# Patient Record
Sex: Female | Born: 1965 | Race: Black or African American | Hispanic: No | Marital: Single | Smoking: Current every day smoker
Health system: Southern US, Community
[De-identification: ages and names within clinical notes are randomized; demographics above are authoritative.]

## PROBLEM LIST (undated history)

## (undated) DIAGNOSIS — Z978 Presence of other specified devices: Secondary | ICD-10-CM

## (undated) DIAGNOSIS — N210 Calculus in bladder: Secondary | ICD-10-CM

## (undated) DIAGNOSIS — N320 Bladder-neck obstruction: Secondary | ICD-10-CM

## (undated) DIAGNOSIS — Z960 Presence of urogenital implants: Secondary | ICD-10-CM

## (undated) DIAGNOSIS — I10 Essential (primary) hypertension: Secondary | ICD-10-CM

## (undated) HISTORY — PX: SALPINGOOPHORECTOMY: SHX82

---

## 2000-07-20 ENCOUNTER — Encounter: Payer: Self-pay | Admitting: Obstetrics and Gynecology

## 2000-07-20 ENCOUNTER — Ambulatory Visit: Admission: RE | Admit: 2000-07-20 | Discharge: 2000-07-20 | Payer: Self-pay | Admitting: Obstetrics and Gynecology

## 2000-07-26 ENCOUNTER — Other Ambulatory Visit: Admission: RE | Admit: 2000-07-26 | Discharge: 2000-07-26 | Payer: Self-pay | Admitting: Gynecology

## 2001-12-19 ENCOUNTER — Emergency Department (HOSPITAL_COMMUNITY): Admission: EM | Admit: 2001-12-19 | Discharge: 2001-12-19 | Payer: Self-pay | Admitting: Emergency Medicine

## 2004-04-20 ENCOUNTER — Inpatient Hospital Stay (HOSPITAL_COMMUNITY): Admission: AD | Admit: 2004-04-20 | Discharge: 2004-04-20 | Payer: Self-pay | Admitting: *Deleted

## 2004-10-01 ENCOUNTER — Emergency Department (HOSPITAL_COMMUNITY): Admission: EM | Admit: 2004-10-01 | Discharge: 2004-10-01 | Payer: Self-pay | Admitting: Emergency Medicine

## 2006-07-13 ENCOUNTER — Inpatient Hospital Stay (HOSPITAL_COMMUNITY): Admission: AD | Admit: 2006-07-13 | Discharge: 2006-07-13 | Payer: Self-pay | Admitting: Family Medicine

## 2009-12-22 ENCOUNTER — Emergency Department (HOSPITAL_COMMUNITY)
Admission: EM | Admit: 2009-12-22 | Discharge: 2009-12-23 | Payer: Self-pay | Source: Home / Self Care | Admitting: Emergency Medicine

## 2010-03-05 ENCOUNTER — Emergency Department (HOSPITAL_COMMUNITY)
Admission: EM | Admit: 2010-03-05 | Discharge: 2010-03-05 | Payer: Self-pay | Source: Home / Self Care | Admitting: Emergency Medicine

## 2010-03-08 LAB — DIFFERENTIAL
Basophils Absolute: 0 10*3/uL (ref 0.0–0.1)
Basophils Relative: 0 % (ref 0–1)
Eosinophils Absolute: 0.1 10*3/uL (ref 0.0–0.7)
Eosinophils Relative: 1 % (ref 0–5)
Lymphocytes Relative: 32 % (ref 12–46)
Lymphs Abs: 2.2 10*3/uL (ref 0.7–4.0)
Monocytes Absolute: 0.5 10*3/uL (ref 0.1–1.0)
Monocytes Relative: 8 % (ref 3–12)
Neutro Abs: 4.2 10*3/uL (ref 1.7–7.7)
Neutrophils Relative %: 59 % (ref 43–77)

## 2010-03-08 LAB — POCT I-STAT, CHEM 8
BUN: 5 mg/dL — ABNORMAL LOW (ref 6–23)
Calcium, Ion: 1.11 mmol/L — ABNORMAL LOW (ref 1.12–1.32)
Chloride: 109 mEq/L (ref 96–112)
Creatinine, Ser: 0.9 mg/dL (ref 0.4–1.2)
Glucose, Bld: 101 mg/dL — ABNORMAL HIGH (ref 70–99)
HCT: 42 % (ref 36.0–46.0)
Hemoglobin: 14.3 g/dL (ref 12.0–15.0)
Potassium: 4.6 mEq/L (ref 3.5–5.1)
Sodium: 141 mEq/L (ref 135–145)
TCO2: 27 mmol/L (ref 0–100)

## 2010-03-08 LAB — CBC
HCT: 38.6 % (ref 36.0–46.0)
Hemoglobin: 13 g/dL (ref 12.0–15.0)
MCH: 30.7 pg (ref 26.0–34.0)
MCHC: 33.7 g/dL (ref 30.0–36.0)
MCV: 91.3 fL (ref 78.0–100.0)
Platelets: 210 10*3/uL (ref 150–400)
RBC: 4.23 MIL/uL (ref 3.87–5.11)
RDW: 14.2 % (ref 11.5–15.5)
WBC: 7.1 10*3/uL (ref 4.0–10.5)

## 2010-03-08 LAB — POCT PREGNANCY, URINE: Preg Test, Ur: NEGATIVE

## 2010-04-02 ENCOUNTER — Other Ambulatory Visit (HOSPITAL_COMMUNITY): Payer: Self-pay | Admitting: Family Medicine

## 2010-04-02 DIAGNOSIS — Z1231 Encounter for screening mammogram for malignant neoplasm of breast: Secondary | ICD-10-CM

## 2010-04-20 ENCOUNTER — Other Ambulatory Visit (HOSPITAL_COMMUNITY): Payer: Self-pay | Admitting: Family Medicine

## 2010-04-20 ENCOUNTER — Ambulatory Visit (HOSPITAL_COMMUNITY)
Admission: RE | Admit: 2010-04-20 | Discharge: 2010-04-20 | Disposition: A | Discharge status: Home / Self Care | Payer: Self-pay | Source: Ambulatory Visit | Attending: Family Medicine | Admitting: Family Medicine

## 2010-04-20 DIAGNOSIS — Z1231 Encounter for screening mammogram for malignant neoplasm of breast: Secondary | ICD-10-CM

## 2010-05-31 ENCOUNTER — Other Ambulatory Visit: Payer: Self-pay | Admitting: Family Medicine

## 2010-06-03 ENCOUNTER — Other Ambulatory Visit (HOSPITAL_COMMUNITY): Payer: Self-pay | Admitting: Family Medicine

## 2010-06-03 DIAGNOSIS — N912 Amenorrhea, unspecified: Secondary | ICD-10-CM

## 2010-06-10 ENCOUNTER — Ambulatory Visit (HOSPITAL_COMMUNITY)
Admission: RE | Admit: 2010-06-10 | Discharge: 2010-06-10 | Disposition: A | Discharge status: Home / Self Care | Payer: Self-pay | Source: Ambulatory Visit | Attending: Family Medicine | Admitting: Family Medicine

## 2010-06-10 ENCOUNTER — Other Ambulatory Visit (HOSPITAL_COMMUNITY): Payer: Self-pay | Admitting: Family Medicine

## 2010-06-10 DIAGNOSIS — D259 Leiomyoma of uterus, unspecified: Secondary | ICD-10-CM | POA: Insufficient documentation

## 2010-06-10 DIAGNOSIS — N912 Amenorrhea, unspecified: Secondary | ICD-10-CM

## 2011-05-05 ENCOUNTER — Other Ambulatory Visit (HOSPITAL_COMMUNITY): Payer: Self-pay | Admitting: Family Medicine

## 2011-05-05 DIAGNOSIS — Z1231 Encounter for screening mammogram for malignant neoplasm of breast: Secondary | ICD-10-CM

## 2011-06-02 ENCOUNTER — Ambulatory Visit (HOSPITAL_COMMUNITY)
Admission: RE | Admit: 2011-06-02 | Discharge: 2011-06-02 | Disposition: A | Discharge status: Home / Self Care | Payer: Self-pay | Source: Ambulatory Visit | Attending: Family Medicine | Admitting: Family Medicine

## 2011-06-02 DIAGNOSIS — Z1231 Encounter for screening mammogram for malignant neoplasm of breast: Secondary | ICD-10-CM | POA: Insufficient documentation

## 2012-04-26 LAB — HM PAP SMEAR

## 2012-05-02 ENCOUNTER — Other Ambulatory Visit (HOSPITAL_COMMUNITY): Payer: Self-pay | Admitting: Family Medicine

## 2012-05-02 DIAGNOSIS — Z1231 Encounter for screening mammogram for malignant neoplasm of breast: Secondary | ICD-10-CM

## 2012-06-04 ENCOUNTER — Ambulatory Visit (HOSPITAL_COMMUNITY)
Admission: RE | Admit: 2012-06-04 | Discharge: 2012-06-04 | Disposition: A | Discharge status: Home / Self Care | Payer: Self-pay | Source: Ambulatory Visit | Attending: Family Medicine | Admitting: Family Medicine

## 2012-06-04 DIAGNOSIS — Z1231 Encounter for screening mammogram for malignant neoplasm of breast: Secondary | ICD-10-CM | POA: Insufficient documentation

## 2012-06-05 ENCOUNTER — Other Ambulatory Visit: Payer: Self-pay | Admitting: Family Medicine

## 2012-06-05 DIAGNOSIS — R928 Other abnormal and inconclusive findings on diagnostic imaging of breast: Secondary | ICD-10-CM

## 2012-06-18 ENCOUNTER — Other Ambulatory Visit: Payer: Self-pay | Admitting: Obstetrics and Gynecology

## 2012-06-18 DIAGNOSIS — R928 Other abnormal and inconclusive findings on diagnostic imaging of breast: Secondary | ICD-10-CM

## 2012-06-19 ENCOUNTER — Encounter (HOSPITAL_COMMUNITY): Payer: Self-pay

## 2012-06-19 ENCOUNTER — Ambulatory Visit (HOSPITAL_COMMUNITY)
Admission: RE | Admit: 2012-06-19 | Discharge: 2012-06-19 | Disposition: A | Discharge status: Home / Self Care | Payer: Self-pay | Source: Ambulatory Visit | Attending: Obstetrics and Gynecology | Admitting: Obstetrics and Gynecology

## 2012-06-19 ENCOUNTER — Other Ambulatory Visit: Payer: Self-pay

## 2012-06-19 DIAGNOSIS — Z1239 Encounter for other screening for malignant neoplasm of breast: Secondary | ICD-10-CM

## 2012-06-19 NOTE — Progress Notes (Signed)
Patient referred to [facility] by the [facility] due to needing additional imaging of her left breast. Screening mammogram completed 06/04/2012 at the [facility].  Pap Smear:    Pap smear not completed today. Last Pap smear was April 2014 at [facility] and normal per patient. Per patient no history of an abnormal Pap smear. No Pap smear results in EPIC.  Physical exam: Breasts Breasts symmetrical. No skin abnormalities bilateral breasts. No nipple retraction bilateral breasts. No nipple discharge bilateral breasts. No lymphadenopathy. No lumps palpated bilateral breasts. No complaints of pain or tenderness on exam. Referred patient to the [facility] of [facility] for left breast diagnostic mammogram and possible ultrasound per recommendation. Appointment scheduled for Wednesday, June 20, 2012 at 0740.   Pelvic/Bimanual No Pap smear completed today since last Pap smear was April 2014. Pap smear not indicated per BCCCP guidelines.

## 2012-06-19 NOTE — Addendum Note (Signed)
Encounter addended by: Saintclair Halsted, RN on: 06/19/2012  4:08 PM<BR>     Documentation filed: Visit Diagnoses

## 2012-06-19 NOTE — Patient Instructions (Signed)
Taught patient how to perform BSE. Patient did not need a Pap smear today due to last Pap smear was April 2014 per patient. Told patient about free cervical cancer screenings to receive a Pap smear if would like one in two years. Let her know BCCCP will cover Pap smears every 3 years unless has a history of abnormal Pap smears. Referred patient to the [facility] of [facility] for left breast diagnostic mammogram and possible ultrasound per recommendation. Appointment scheduled for Wednesday, June 20, 2012 at 0740. Patient aware of appointment and will be there. Smoking cessation information given to patient and information about smoking cessation program offered at the [facility]. Patient verbalized understanding.

## 2012-06-20 ENCOUNTER — Ambulatory Visit
Admission: RE | Admit: 2012-06-20 | Discharge: 2012-06-20 | Disposition: A | Discharge status: Home / Self Care | Payer: No Typology Code available for payment source | Source: Ambulatory Visit | Attending: Family Medicine | Admitting: Family Medicine

## 2012-06-20 DIAGNOSIS — R928 Other abnormal and inconclusive findings on diagnostic imaging of breast: Secondary | ICD-10-CM

## 2012-11-27 ENCOUNTER — Other Ambulatory Visit: Payer: Self-pay | Admitting: Obstetrics and Gynecology

## 2012-11-27 DIAGNOSIS — N6320 Unspecified lump in the left breast, unspecified quadrant: Secondary | ICD-10-CM

## 2012-12-21 ENCOUNTER — Ambulatory Visit
Admission: RE | Admit: 2012-12-21 | Discharge: 2012-12-21 | Disposition: A | Discharge status: Home / Self Care | Payer: Self-pay | Source: Ambulatory Visit | Attending: Obstetrics and Gynecology | Admitting: Obstetrics and Gynecology

## 2012-12-21 DIAGNOSIS — N6320 Unspecified lump in the left breast, unspecified quadrant: Secondary | ICD-10-CM

## 2013-07-25 ENCOUNTER — Other Ambulatory Visit: Payer: Self-pay | Admitting: Obstetrics and Gynecology

## 2013-07-25 DIAGNOSIS — N63 Unspecified lump in unspecified breast: Secondary | ICD-10-CM

## 2013-07-31 ENCOUNTER — Other Ambulatory Visit: Payer: Self-pay

## 2013-07-31 ENCOUNTER — Other Ambulatory Visit: Payer: Self-pay | Admitting: Obstetrics and Gynecology

## 2013-07-31 DIAGNOSIS — N63 Unspecified lump in unspecified breast: Secondary | ICD-10-CM

## 2013-08-06 ENCOUNTER — Ambulatory Visit
Admission: RE | Admit: 2013-08-06 | Discharge: 2013-08-06 | Disposition: A | Discharge status: Home / Self Care | Payer: 59 | Source: Ambulatory Visit | Attending: Obstetrics and Gynecology | Admitting: Obstetrics and Gynecology

## 2013-08-06 ENCOUNTER — Encounter (INDEPENDENT_AMBULATORY_CARE_PROVIDER_SITE_OTHER): Payer: Self-pay

## 2013-08-06 DIAGNOSIS — N63 Unspecified lump in unspecified breast: Secondary | ICD-10-CM

## 2013-09-19 LAB — LAB REPORT - SCANNED: A1c: 5.7

## 2013-10-23 LAB — LAB REPORT - SCANNED

## 2013-12-23 ENCOUNTER — Encounter (HOSPITAL_COMMUNITY): Payer: Self-pay

## 2014-08-20 LAB — LAB REPORT - SCANNED: A1c: 5.8

## 2014-09-22 ENCOUNTER — Other Ambulatory Visit: Payer: Self-pay

## 2014-09-22 DIAGNOSIS — Z1231 Encounter for screening mammogram for malignant neoplasm of breast: Secondary | ICD-10-CM

## 2014-09-26 ENCOUNTER — Ambulatory Visit: Payer: Self-pay

## 2014-09-30 ENCOUNTER — Other Ambulatory Visit (HOSPITAL_COMMUNITY): Payer: Self-pay | Admitting: Internal Medicine

## 2014-09-30 ENCOUNTER — Ambulatory Visit
Admission: RE | Admit: 2014-09-30 | Discharge: 2014-09-30 | Disposition: A | Discharge status: Home / Self Care | Payer: Self-pay | Source: Ambulatory Visit

## 2014-09-30 ENCOUNTER — Ambulatory Visit (HOSPITAL_COMMUNITY)
Admission: RE | Admit: 2014-09-30 | Discharge: 2014-09-30 | Disposition: A | Discharge status: Home / Self Care | Payer: 59 | Source: Ambulatory Visit | Attending: Internal Medicine | Admitting: Internal Medicine

## 2014-09-30 DIAGNOSIS — R52 Pain, unspecified: Secondary | ICD-10-CM

## 2014-09-30 DIAGNOSIS — Z1231 Encounter for screening mammogram for malignant neoplasm of breast: Secondary | ICD-10-CM

## 2014-09-30 DIAGNOSIS — R2231 Localized swelling, mass and lump, right upper limb: Secondary | ICD-10-CM | POA: Insufficient documentation

## 2014-10-02 ENCOUNTER — Other Ambulatory Visit: Payer: Self-pay | Admitting: Internal Medicine

## 2014-10-02 DIAGNOSIS — R928 Other abnormal and inconclusive findings on diagnostic imaging of breast: Secondary | ICD-10-CM

## 2014-10-09 ENCOUNTER — Ambulatory Visit
Admission: RE | Admit: 2014-10-09 | Discharge: 2014-10-09 | Disposition: A | Discharge status: Home / Self Care | Payer: 59 | Source: Ambulatory Visit | Attending: Internal Medicine | Admitting: Internal Medicine

## 2014-10-09 ENCOUNTER — Other Ambulatory Visit: Payer: Self-pay | Admitting: Internal Medicine

## 2014-10-09 DIAGNOSIS — R928 Other abnormal and inconclusive findings on diagnostic imaging of breast: Secondary | ICD-10-CM

## 2014-10-17 ENCOUNTER — Ambulatory Visit
Admission: RE | Admit: 2014-10-17 | Discharge: 2014-10-17 | Disposition: A | Discharge status: Home / Self Care | Payer: 59 | Source: Ambulatory Visit | Attending: Internal Medicine | Admitting: Internal Medicine

## 2014-10-17 ENCOUNTER — Other Ambulatory Visit: Payer: Self-pay | Admitting: Internal Medicine

## 2014-10-17 DIAGNOSIS — R928 Other abnormal and inconclusive findings on diagnostic imaging of breast: Secondary | ICD-10-CM

## 2014-10-17 HISTORY — PX: BREAST BIOPSY: SHX20

## 2014-10-24 LAB — HM PAP SMEAR
Chlamydia, Swab/Urine, PCR: NEGATIVE
HM Pap smear: ABNORMAL
HPV, high-risk: NEGATIVE

## 2015-04-05 ENCOUNTER — Encounter (HOSPITAL_COMMUNITY): Payer: Self-pay | Admitting: Emergency Medicine

## 2015-04-05 ENCOUNTER — Emergency Department (HOSPITAL_COMMUNITY): Payer: BLUE CROSS/BLUE SHIELD

## 2015-04-05 ENCOUNTER — Emergency Department (HOSPITAL_COMMUNITY)
Admission: EM | Admit: 2015-04-05 | Discharge: 2015-04-05 | Disposition: A | Discharge status: Home / Self Care | Payer: BLUE CROSS/BLUE SHIELD | Attending: Emergency Medicine | Admitting: Emergency Medicine

## 2015-04-05 DIAGNOSIS — I10 Essential (primary) hypertension: Secondary | ICD-10-CM | POA: Diagnosis not present

## 2015-04-05 DIAGNOSIS — Z3202 Encounter for pregnancy test, result negative: Secondary | ICD-10-CM | POA: Diagnosis not present

## 2015-04-05 DIAGNOSIS — R339 Retention of urine, unspecified: Secondary | ICD-10-CM | POA: Diagnosis present

## 2015-04-05 DIAGNOSIS — F17210 Nicotine dependence, cigarettes, uncomplicated: Secondary | ICD-10-CM | POA: Diagnosis not present

## 2015-04-05 DIAGNOSIS — R1030 Lower abdominal pain, unspecified: Secondary | ICD-10-CM

## 2015-04-05 DIAGNOSIS — N320 Bladder-neck obstruction: Secondary | ICD-10-CM | POA: Diagnosis not present

## 2015-04-05 HISTORY — DX: Essential (primary) hypertension: I10

## 2015-04-05 LAB — BASIC METABOLIC PANEL
Anion gap: 12 (ref 5–15)
BUN: 12 mg/dL (ref 6–20)
CO2: 21 mmol/L — ABNORMAL LOW (ref 22–32)
Calcium: 9.1 mg/dL (ref 8.9–10.3)
Chloride: 110 mmol/L (ref 101–111)
Creatinine, Ser: 0.73 mg/dL (ref 0.44–1.00)
GFR calc Af Amer: >60 mL/min (ref >60)
GFR calc non Af Amer: >60 mL/min (ref >60)
Glucose, Bld: 94 mg/dL (ref 65–99)
Potassium: 4.6 mmol/L (ref 3.5–5.1)
Sodium: 143 mmol/L (ref 135–145)

## 2015-04-05 LAB — URINALYSIS, ROUTINE W REFLEX MICROSCOPIC
BILIRUBIN URINE: NEGATIVE
Glucose, UA: NEGATIVE mg/dL
Ketones, ur: NEGATIVE mg/dL
NITRITE: NEGATIVE
PH: 6.5 (ref 5.0–8.0)
Protein, ur: 30 mg/dL — AB
Specific Gravity, Urine: 1.019 (ref 1.005–1.030)

## 2015-04-05 LAB — CBC WITH DIFFERENTIAL/PLATELET
Basophils Absolute: 0 10*3/uL (ref 0.0–0.1)
Basophils Relative: 0 %
Eosinophils Absolute: 0.1 10*3/uL (ref 0.0–0.7)
Eosinophils Relative: 2 %
HCT: 36 % (ref 36.0–46.0)
Hemoglobin: 11.6 g/dL — ABNORMAL LOW (ref 12.0–15.0)
Lymphocytes Relative: 37 %
Lymphs Abs: 2.2 10*3/uL (ref 0.7–4.0)
MCH: 28.3 pg (ref 26.0–34.0)
MCHC: 32.2 g/dL (ref 30.0–36.0)
MCV: 87.8 fL (ref 78.0–100.0)
Monocytes Absolute: 0.4 10*3/uL (ref 0.1–1.0)
Monocytes Relative: 7 %
Neutro Abs: 3.2 10*3/uL (ref 1.7–7.7)
Neutrophils Relative %: 54 %
Platelets: 200 10*3/uL (ref 150–400)
RBC: 4.1 MIL/uL (ref 3.87–5.11)
RDW: 14.3 % (ref 11.5–15.5)
WBC: 6 10*3/uL (ref 4.0–10.5)

## 2015-04-05 LAB — URINE MICROSCOPIC-ADD ON

## 2015-04-05 LAB — PREGNANCY, URINE: PREG TEST UR: NEGATIVE

## 2015-04-05 MED ORDER — MORPHINE SULFATE (PF) 4 MG/ML IV SOLN
4.0000 mg | Freq: Once | INTRAVENOUS | Status: AC
  Administered 2015-04-05: 4 mg via INTRAVENOUS
  Filled 2015-04-05: qty 1

## 2015-04-05 MED ORDER — SODIUM CHLORIDE 0.9 % IV BOLUS (SEPSIS)
1000.0000 mL | Freq: Once | INTRAVENOUS | Status: AC
  Administered 2015-04-05: 1000 mL via INTRAVENOUS

## 2015-04-05 MED ORDER — DEXTROSE 5 % IV SOLN
1.0000 g | Freq: Once | INTRAVENOUS | Status: AC
  Administered 2015-04-05: 1 g via INTRAVENOUS
  Filled 2015-04-05: qty 10

## 2015-04-05 MED ORDER — KETOROLAC TROMETHAMINE 30 MG/ML IJ SOLN
30.0000 mg | Freq: Once | INTRAMUSCULAR | Status: AC
  Administered 2015-04-05: 30 mg via INTRAVENOUS
  Filled 2015-04-05: qty 1

## 2015-04-05 NOTE — ED Provider Notes (Signed)
CSN: [id number]     Arrival date & time 04/05/15  0506 History   First MD Initiated Contact with Patient 04/05/15 [phone]     Chief Complaint  Patient presents with  . Urinary Urgency  . Urinary Retention     (Consider location/radiation/quality/duration/timing/severity/associated sxs/prior Treatment) HPI Patient presents to the Emergency Department complaining of dysuria and urinary retention. The patient is a current smoker (0.5 PPD), has history of hypertension. The patient states that the symptoms began yesterday morning. She states that every time she urinates she experiences dysuria described as a burning sensation. She also states that she feels like her urine is not coming out and that she has to push, but even after urinating she states her abdomen still feels like it is full of urine. The patient states that she has had a previous UTI before where she experienced dysuria but has not had the retention symptoms before. She states that she is currently not sexually active and is actively going through menopause, cannot remember when LMP was. The patient denies hematuria, vaginal discharge or bleeding, abdominal pain, flank pain, fevers, chills, night sweats, nausea, vomiting, diarrhea, SOB, chest pain, dizziness, lightheadedness or palpitations.   Past Medical History  Diagnosis Date  . Hypertension    History reviewed. No pertinent past surgical history. Family History  Problem Relation Age of Onset  . Cancer Mother     lung cancer  . Breast cancer Sister   . Seizures Sister    Social History  Substance Use Topics  . Smoking status: Current Every Day Smoker -- 0.25 packs/day for 32 years    Types: Cigarettes  . Smokeless tobacco: Never Used  . Alcohol Use: No   OB History    Gravida Para Term Preterm AB TAB SAB Ectopic Multiple Living   3 2 2  1 1    2      Review of Systems All other systems negative except as documented in the HPI. All pertinent positives and negatives as  reviewed in the HPI.   Allergies  Review of patient's allergies indicates no known allergies.  Home Medications   Prior to Admission medications   Not on File   BP 190/109 mmHg  Pulse 81  Temp(Src) 98 F (36.7 C) (Oral)  Resp 17  SpO2 99%  LMP 04/17/2010 Physical Exam  Constitutional: She is oriented to person, place, and time. She appears well-developed and well-nourished. No distress.  HENT:  Head: Normocephalic and atraumatic.  Right Ear: External ear normal.  Left Ear: External ear normal.  Eyes: Conjunctivae and EOM are normal. Pupils are equal, round, and reactive to light.  Neck: Normal range of motion. Neck supple.  Cardiovascular: Normal rate, regular rhythm and normal heart sounds.  Exam reveals no gallop and no friction rub.   No murmur heard. Pulmonary/Chest: Effort normal and breath sounds normal. No respiratory distress. She has no wheezes. She has no rales. She exhibits no tenderness.  Abdominal: Soft. Bowel sounds are normal. She exhibits distension. She exhibits no mass. There is tenderness (suprapubic). There is no rebound and no guarding.  Musculoskeletal: Normal range of motion.  Neurological: She is alert and oriented to person, place, and time.  Skin: Skin is warm and dry. No erythema.  Psychiatric: She has a normal mood and affect. Her behavior is normal. Thought content normal.    ED Course  Procedures (including critical care time) Labs Review Labs Reviewed  URINALYSIS, ROUTINE W REFLEX MICROSCOPIC (NOT AT [facility])  Imaging Review No results found. I have personally reviewed and evaluated these images and lab results as part of my medical decision-making.   EKG Interpretation None    0742 Patient in significant amount of pain, tearful and uneasy.   0905 Patient states that her pain is much improved after administration of Tordol and Morphine. 1040 Patient states that she is no longer to urinate, tried to cath patient, but was unable to do  so, patient is in pain, will give another dose of Morphine.  There was a significant delay in spoke with urology.  Once we got the results.  Due to the fact she has obstructed bladder outlet patient was seen by urology here in the emergency department and will follow up with them for further evaluation and care.  Patient is advised with plan at all questions were answered    Dalia Heading, PA-C 04/05/15 1412  Tanna Furry, MD 04/14/15 2253

## 2015-04-05 NOTE — ED Notes (Signed)
CT advised patient needs a pregnancy test before coming for her scan

## 2015-04-05 NOTE — ED Notes (Signed)
CT aware pregnancy test results back and patient is ready for her scan

## 2015-04-05 NOTE — ED Notes (Signed)
Urinary leg bag provided to patient per Dr. Ottis Stain request. Pt instructed on foley catheter care at home, acknowledged understanding.

## 2015-04-05 NOTE — ED Notes (Addendum)
PA Lawyer advised to attempt to cath patient again with a size 18 fr foley catheter and let him know if there is still resistance hindering advancement of tube.

## 2015-04-05 NOTE — ED Notes (Signed)
Patient here with urinary urgency and retention that started yesterday.  She denies any nausea or vomiting, but patient is having pain when she does have to urinate.

## 2015-04-05 NOTE — ED Notes (Signed)
Urologist at bedside to place foley catheter.

## 2015-04-05 NOTE — ED Notes (Signed)
ED tech advised she was unable to advance catheter into patient's bladder due to resistance, PA Lawyer made aware, advised he would order more pain medication for patient and that CT scan needs to be done as soon as possible.

## 2015-04-05 NOTE — ED Notes (Signed)
Urologist at bedside.

## 2015-04-05 NOTE — ED Notes (Signed)
This RN was called into the room, patient was bent over in pain, crying, stating she is now unable to urinate. PA Lawyer made aware, PA advised patient can be in/out cathed.

## 2015-04-05 NOTE — Consult Note (Signed)
Urology Consult   Physician requesting consult: Alyson Locket, MD  Reason for consult: acute urinary retention  History of Present Illness: Melanie Reed is a 50 y.o. female with sudden onset dysuria and burning beginning 24 hours ago followed by sudden onset of inability to void (as of this morning). She has been experiencing pain and suprapubic pressure. CT imaging was obtained and noted a 15 x 8 mm proximal to mid urethral calculus and a significantly distended bladder with moderate bilateral hydronephrosis. Serum labs were within the normal limits. She was able to void in very small spurts and that urine was sent for culture, UA without significant infectious parameters.  She denies a history of voiding or storage urinary symptoms, hematuria, UTIs, STDs, urolithiasis, GU malignancy/trauma/surgery.  The patient denies hematuria, vaginal discharge or bleeding, abdominal pain, flank pain, fevers, chills, night sweats, nausea, vomiting, diarrhea, SOB, chest pain, dizziness, lightheadedness or palpitations.   Past Medical History  Diagnosis Date  . Hypertension     History reviewed. No pertinent past surgical history.   Current Hospital Medications:  Home meds:    Medication List    Notice    You have not been prescribed any medications.      Scheduled Meds: .  morphine injection  4 mg Intravenous Once   Continuous Infusions:  PRN Meds:.  Allergies: No Known Allergies  Family History  Problem Relation Age of Onset  . Cancer Mother     lung cancer  . Breast cancer Sister   . Seizures Sister     Social History:  reports that she has been smoking Cigarettes.  She has a 8 pack-year smoking history. She has never used smokeless tobacco. She reports that she uses illicit drugs ("Crack" cocaine). She reports that she does not drink alcohol.  ROS: A complete review of systems was performed.  All systems are negative except for pertinent findings as noted.  Physical Exam  (performed with a chaperone):  Vital signs in last 24 hours: Temp:  [97.7 F (36.5 C)-98 F (36.7 C)] 98 F (36.7 C) (02/12 0555) Pulse Rate:  [55-84] 56 (02/12 1015) Resp:  [16-17] 17 (02/12 1015) BP: (151-196)/(74-121) 157/76 mmHg (02/12 1145) SpO2:  [95 %-100 %] 95 % (02/12 1145) Constitutional:  Alert and oriented, No acute distress Cardiovascular: Regular rate and rhythm, No JVD Respiratory: Normal respiratory effort, Lungs clear bilaterally GI: Abdomen is soft, nontender, nondistended, no abdominal masses, no palpable urethral mass. GU: No CVA tenderness, normal female pelvic anatoms Lymphatic: No lymphadenopathy Neurologic: Grossly intact, no focal deficits Psychiatric: Normal mood and affect  Laboratory Data:   Recent Labs  04/05/15 0730  WBC 6.0  HGB 11.6*  HCT 36.0  PLT 200     Recent Labs  04/05/15 0730  NA 143  K 4.6  CL 110  GLUCOSE 94  BUN 12  CALCIUM 9.1  CREATININE 0.73     Results for orders placed or performed during the hospital encounter of 04/05/15 (from the past 24 hour(s))  Urinalysis, Routine w reflex microscopic-may I&O cath if menses (not at [facility])     Status: Abnormal   Collection Time: 04/05/15  5:15 AM  Result Value Ref Range   Color, Urine YELLOW YELLOW   APPearance CLEAR CLEAR   Specific Gravity, Urine 1.019 1.005 - 1.030   pH 6.5 5.0 - 8.0   Glucose, UA NEGATIVE NEGATIVE mg/dL   Hgb urine dipstick LARGE (A) NEGATIVE   Bilirubin Urine NEGATIVE NEGATIVE   Ketones, ur  NEGATIVE NEGATIVE mg/dL   Protein, ur 30 (A) NEGATIVE mg/dL   Nitrite NEGATIVE NEGATIVE   Leukocytes, UA SMALL (A) NEGATIVE  Urine microscopic-add on     Status: Abnormal   Collection Time: 04/05/15  5:15 AM  Result Value Ref Range   Squamous Epithelial / LPF 0-5 (A) NONE SEEN   WBC, UA 6-30 0 - 5 WBC/hpf   RBC / HPF 6-30 0 - 5 RBC/hpf   Bacteria, UA MANY (A) NONE SEEN  Pregnancy, urine     Status: None   Collection Time: 04/05/15  5:15 AM  Result Value Ref  Range   Preg Test, Ur NEGATIVE NEGATIVE  Basic metabolic panel     Status: Abnormal   Collection Time: 04/05/15  7:30 AM  Result Value Ref Range   Sodium 143 135 - 145 mmol/L   Potassium 4.6 3.5 - 5.1 mmol/L   Chloride 110 101 - 111 mmol/L   CO2 21 (L) 22 - 32 mmol/L   Glucose, Bld 94 65 - 99 mg/dL   BUN 12 6 - 20 mg/dL   Creatinine, Ser 0.73 0.44 - 1.00 mg/dL   Calcium 9.1 8.9 - 10.3 mg/dL   GFR calc non Af Amer >60 >60 mL/min   GFR calc Af Amer >60 >60 mL/min   Anion gap 12 5 - 15  CBC with Differential     Status: Abnormal   Collection Time: 04/05/15  7:30 AM  Result Value Ref Range   WBC 6.0 4.0 - 10.5 K/uL   RBC 4.10 3.87 - 5.11 MIL/uL   Hemoglobin 11.6 (L) 12.0 - 15.0 g/dL   HCT 36.0 36.0 - 46.0 %   MCV 87.8 78.0 - 100.0 fL   MCH 28.3 26.0 - 34.0 pg   MCHC 32.2 30.0 - 36.0 g/dL   RDW 14.3 11.5 - 15.5 %   Platelets 200 150 - 400 K/uL   Neutrophils Relative % 54 %   Neutro Abs 3.2 1.7 - 7.7 K/uL   Lymphocytes Relative 37 %   Lymphs Abs 2.2 0.7 - 4.0 K/uL   Monocytes Relative 7 %   Monocytes Absolute 0.4 0.1 - 1.0 K/uL   Eosinophils Relative 2 %   Eosinophils Absolute 0.1 0.0 - 0.7 K/uL   Basophils Relative 0 %   Basophils Absolute 0.0 0.0 - 0.1 K/uL   No results found for this or any previous visit (from the past 240 hour(s)).  Renal Function:  Recent Labs  04/05/15 0730  CREATININE 0.73   CrCl cannot be calculated (Unknown ideal weight.).  Radiologic Imaging: Ct Renal Stone Study  04/05/2015  CLINICAL DATA:  Lower abdominal pain, urinary urgency and retention. EXAM: CT ABDOMEN AND PELVIS WITHOUT CONTRAST TECHNIQUE: Multidetector CT imaging of the abdomen and pelvis was performed following the standard protocol without IV contrast. COMPARISON:  None. FINDINGS: Lower chest:  No acute findings. Hepatobiliary: No mass visualized on this un-enhanced exam. Pancreas: No mass or inflammatory process identified on this un-enhanced exam. Spleen: Within normal limits in  size. Adrenals/Urinary Tract: Bladder is distended. There is moderate bilateral hydronephrosis related to bladder outlet obstruction. Slightly irregular calcification is seen within the midline just below the level of the bladder, measuring 15 x 9 mm, presumably stone within the posterior urethra. Stomach/Bowel: No evidence of obstruction, inflammatory process, or abnormal fluid collections. Vascular/Lymphatic: Atherosclerotic changes of the normal-caliber abdominal aorta. Reproductive: No mass or other significant abnormality. Other: None. Musculoskeletal: Mild degenerative change within the spine. No acute osseous abnormality.  IMPRESSION: 1. Bladder distension and bilateral hydronephrosis, moderate in degree. 15 x 9 mm calcification within the midline just below the level of the bladder, presumed stone within the posterior urethra causing bladder outlet obstruction. Urology consult recommended. 2. Remainder of the abdomen and pelvis CT is unremarkable. Electronically Signed   By: Franki Cabot M.D.   On: 04/05/2015 11:47    I independently reviewed the above imaging studies.  Procedures: Foley catheter placement (complex)  Urethra was prepped and draped in usual sterile fashion. 1% viscous lidocaine jelly was administered per urethra. An 18 Fr Council catheter was passed with moderate resistance into the bladder. Nearly 1300 cc of yellow urine with debris was returned. Could feel dislodgement of stone with placement of catheter tip. Urine was sent for culture.   Impression/Recommendation:  1- urethral calculus- 15 mm urethral stone dislodged from mid urethra with 18 Fr foley catheter. Likely unable to pass on its own and will need removal via Cystolitholapaxy. Urology to arrange.  2- acute urinary retention-2/2 above. Urine murky initiatlly. Sent for culture. Recommend starting on empiric cipro 500 mg BID for 14 day course  3-moderate bilateral renal obstruction-2/2 above, no ureteral stones,  transition point. Creatinine within normal limits, obstruction <8 hours. Discussed the need to drink to thirst as kidneys will potentially overcompensate for obstruction. Discussed that if she became febrile, developed altered mental status she would need to return to the ED for evaluation for symptoms of pathologic post-obstructive diuresis.  Dr. Tresa Moore was available  Star Age 04/05/2015, 1:01 PM    I have evaluated referring and new records from this encounter and agree with assesment and plan. Pt will have close GU follow up to schedule cystolithalopexy.

## 2015-04-05 NOTE — Discharge Instructions (Signed)
Return here as needed.  Follow-up with the urologist

## 2015-04-05 NOTE — ED Notes (Signed)
Tried to in and out cath pt, however, cath would not insert more than 1/2" into the urethra. Tech stopped, didn't want to cause any trauma to patient.

## 2015-04-06 LAB — URINE CULTURE

## 2015-04-08 ENCOUNTER — Other Ambulatory Visit: Payer: Self-pay | Admitting: Urology

## 2015-04-08 ENCOUNTER — Encounter (HOSPITAL_BASED_OUTPATIENT_CLINIC_OR_DEPARTMENT_OTHER): Payer: Self-pay | Admitting: *Deleted

## 2015-04-08 NOTE — Progress Notes (Signed)
NPO AFTER MN.  ARRIVE AT 1015.  CURRENT LAB RESULTS IN CHART AND EPIC.

## 2015-04-09 ENCOUNTER — Ambulatory Visit (HOSPITAL_BASED_OUTPATIENT_CLINIC_OR_DEPARTMENT_OTHER): Payer: BLUE CROSS/BLUE SHIELD | Admitting: Anesthesiology

## 2015-04-09 ENCOUNTER — Encounter (HOSPITAL_BASED_OUTPATIENT_CLINIC_OR_DEPARTMENT_OTHER): Payer: Self-pay | Admitting: *Deleted

## 2015-04-09 ENCOUNTER — Encounter (HOSPITAL_BASED_OUTPATIENT_CLINIC_OR_DEPARTMENT_OTHER)
Admission: RE | Disposition: A | Discharge status: Home / Self Care | Payer: Self-pay | Source: Ambulatory Visit | Attending: Urology

## 2015-04-09 ENCOUNTER — Ambulatory Visit (HOSPITAL_BASED_OUTPATIENT_CLINIC_OR_DEPARTMENT_OTHER)
Admission: RE | Admit: 2015-04-09 | Discharge: 2015-04-09 | Disposition: A | Discharge status: Home / Self Care | Payer: BLUE CROSS/BLUE SHIELD | Source: Ambulatory Visit | Attending: Urology | Admitting: Urology

## 2015-04-09 DIAGNOSIS — I10 Essential (primary) hypertension: Secondary | ICD-10-CM | POA: Diagnosis not present

## 2015-04-09 DIAGNOSIS — N1330 Unspecified hydronephrosis: Secondary | ICD-10-CM | POA: Diagnosis not present

## 2015-04-09 DIAGNOSIS — F17210 Nicotine dependence, cigarettes, uncomplicated: Secondary | ICD-10-CM | POA: Diagnosis not present

## 2015-04-09 DIAGNOSIS — N210 Calculus in bladder: Secondary | ICD-10-CM | POA: Insufficient documentation

## 2015-04-09 HISTORY — PX: CYSTOSCOPY WITH LITHOLAPAXY: SHX1425

## 2015-04-09 HISTORY — DX: Presence of urogenital implants: Z96.0

## 2015-04-09 HISTORY — DX: Presence of other specified devices: Z97.8

## 2015-04-09 HISTORY — DX: Calculus in bladder: N21.0

## 2015-04-09 HISTORY — DX: Bladder-neck obstruction: N32.0

## 2015-04-09 SURGERY — CYSTOSCOPY, WITH BLADDER CALCULUS LITHOLAPAXY
Anesthesia: General

## 2015-04-09 MED ORDER — DEXAMETHASONE SODIUM PHOSPHATE 10 MG/ML IJ SOLN
INTRAMUSCULAR | Status: DC | PRN
  Administered 2015-04-09: 10 mg via INTRAVENOUS

## 2015-04-09 MED ORDER — FENTANYL CITRATE (PF) 100 MCG/2ML IJ SOLN
25.0000 ug | INTRAMUSCULAR | Status: DC | PRN
  Filled 2015-04-09: qty 1

## 2015-04-09 MED ORDER — MIDAZOLAM HCL 2 MG/2ML IJ SOLN
INTRAMUSCULAR | Status: AC
  Filled 2015-04-09: qty 2

## 2015-04-09 MED ORDER — CEFAZOLIN SODIUM 1-5 GM-% IV SOLN
1.0000 g | INTRAVENOUS | Status: DC
  Filled 2015-04-09: qty 50

## 2015-04-09 MED ORDER — PROPOFOL 500 MG/50ML IV EMUL
INTRAVENOUS | Status: DC | PRN
  Administered 2015-04-09: 200 mL via INTRAVENOUS

## 2015-04-09 MED ORDER — CEPHALEXIN 500 MG PO CAPS: 500.0000 mg | ORAL_CAPSULE | Freq: Two times a day (BID) | ORAL | Status: DC

## 2015-04-09 MED ORDER — ONDANSETRON HCL 4 MG/2ML IJ SOLN
INTRAMUSCULAR | Status: DC | PRN
  Administered 2015-04-09: 4 mg via INTRAVENOUS

## 2015-04-09 MED ORDER — MIDAZOLAM HCL 5 MG/5ML IJ SOLN
INTRAMUSCULAR | Status: DC | PRN
  Administered 2015-04-09: 2 mg via INTRAVENOUS

## 2015-04-09 MED ORDER — FENTANYL CITRATE (PF) 100 MCG/2ML IJ SOLN
INTRAMUSCULAR | Status: AC
Start: 2015-04-09 — End: 2015-04-09
  Filled 2015-04-09: qty 2

## 2015-04-09 MED ORDER — LACTATED RINGERS IV SOLN
INTRAVENOUS | Status: DC
  Administered 2015-04-09: 11:00:00 via INTRAVENOUS
  Filled 2015-04-09: qty 1000

## 2015-04-09 MED ORDER — LIDOCAINE HCL (CARDIAC) 20 MG/ML IV SOLN
INTRAVENOUS | Status: DC | PRN
  Administered 2015-04-09: 80 mg via INTRAVENOUS

## 2015-04-09 MED ORDER — CEFAZOLIN SODIUM-DEXTROSE 2-3 GM-% IV SOLR
INTRAVENOUS | Status: AC
  Filled 2015-04-09: qty 50

## 2015-04-09 MED ORDER — FENTANYL CITRATE (PF) 100 MCG/2ML IJ SOLN
INTRAMUSCULAR | Status: DC | PRN
  Administered 2015-04-09: 100 ug via INTRAVENOUS

## 2015-04-09 MED ORDER — PROMETHAZINE HCL 25 MG/ML IJ SOLN
6.2500 mg | INTRAMUSCULAR | Status: DC | PRN
Start: 2015-04-09 — End: 2015-04-09
  Filled 2015-04-09: qty 1

## 2015-04-09 MED ORDER — CEFAZOLIN SODIUM-DEXTROSE 2-3 GM-% IV SOLR
2.0000 g | INTRAVENOUS | Status: AC
  Administered 2015-04-09: 2 g via INTRAVENOUS
  Filled 2015-04-09: qty 50

## 2015-04-09 SURGICAL SUPPLY — 46 items
ADAPTER CATH URET PLST 4-6FR (CATHETERS) IMPLANT
ADPR CATH URET STRL DISP 4-6FR (CATHETERS)
BAG DRAIN URO-CYSTO SKYTR STRL (DRAIN) ×3 IMPLANT
BAG DRN UROCATH (DRAIN) ×1
BAG URINE LEG 500ML (DRAIN) ×2 IMPLANT
BASKET LASER NITINOL 1.9FR (BASKET) IMPLANT
BASKET STNLS GEMINI 4WIRE 3FR (BASKET) IMPLANT
BASKET ZERO TIP NITINOL 2.4FR (BASKET) IMPLANT
BSKT STON RTRVL 120 1.9FR (BASKET)
BSKT STON RTRVL GEM 120X11 3FR (BASKET)
BSKT STON RTRVL ZERO TP 2.4FR (BASKET)
CANISTER SUCT LVC 12 LTR MEDI- (MISCELLANEOUS) ×3 IMPLANT
CARTRIDGE STONEBREAK CO2 KIDNE (ELECTROSURGICAL) ×2 IMPLANT
CATH FOLEY 2WAY SLVR  5CC 24FR (CATHETERS) ×2
CATH FOLEY 2WAY SLVR 5CC 24FR (CATHETERS) IMPLANT
CATH INTERMIT  6FR 70CM (CATHETERS) IMPLANT
CATH URET 5FR 28IN CONE TIP (BALLOONS)
CATH URET 5FR 28IN OPEN ENDED (CATHETERS) IMPLANT
CATH URET 5FR 70CM CONE TIP (BALLOONS) IMPLANT
CLOTH BEACON ORANGE TIMEOUT ST (SAFETY) ×6 IMPLANT
ELECTROHYDROLIC PROBE 9FR (MISCELLANEOUS) IMPLANT
FIBER LASER FLEXIVA 1000 (UROLOGICAL SUPPLIES) IMPLANT
FIBER LASER FLEXIVA 365 (UROLOGICAL SUPPLIES) IMPLANT
FIBER LASER FLEXIVA 550 (UROLOGICAL SUPPLIES) IMPLANT
FIBER LASER TRAC TIP (UROLOGICAL SUPPLIES) IMPLANT
GLOVE BIO SURGEON STRL SZ8 (GLOVE) ×3 IMPLANT
GOWN STRL REUS W/ TWL XL LVL3 (GOWN DISPOSABLE) ×1 IMPLANT
GOWN STRL REUS W/TWL XL LVL3 (GOWN DISPOSABLE) ×3
GOWN XL W/COTTON TOWEL STD (GOWNS) ×3 IMPLANT
GUIDEWIRE 0.038 PTFE COATED (WIRE) IMPLANT
GUIDEWIRE ANG ZIPWIRE 038X150 (WIRE) IMPLANT
GUIDEWIRE STR DUAL SENSOR (WIRE) IMPLANT
KIT BALLIN UROMAX 15FX10 (LABEL) IMPLANT
KIT BALLN UROMAX 15FX4 (MISCELLANEOUS) IMPLANT
KIT BALLN UROMAX 26 75X4 (MISCELLANEOUS)
KIT ROOM TURNOVER WOR (KITS) ×3 IMPLANT
LASER FIBER DISP (UROLOGICAL SUPPLIES) IMPLANT
MANIFOLD NEPTUNE II (INSTRUMENTS) IMPLANT
PACK CYSTO (CUSTOM PROCEDURE TRAY) ×3 IMPLANT
PROBE LITHO 3.3FR 2137.235 (UROLOGICAL SUPPLIES) IMPLANT
PROBE LITHO 5.0FR 2137.1505 (MISCELLANEOUS) IMPLANT
PROBE PNEUMATIC 1.6MM (ELECTROSURGICAL) ×2 IMPLANT
SET HIGH PRES BAL DIL (LABEL)
TUBE CONNECTING 12'X1/4 (SUCTIONS)
TUBE CONNECTING 12X1/4 (SUCTIONS) IMPLANT
WATER STERILE IRR 500ML POUR (IV SOLUTION) IMPLANT

## 2015-04-09 NOTE — Op Note (Signed)
Preoperative diagnosis: 15 mm bladder calculus  Postoperative diagnosis: Same  Principal procedure: Cystolitholapaxy a 15 mm bladder calculus  Surgeon: Braylan Faul  Anesthesia: Gen. with LMA  Drains: 79 French Foley catheter  Specimen: Stone fragments, to the patient's boyfriend Robert  Complications: None  Indications: 50 year old female presenting yesterday with an obstructing 15 mm urethral calculus. This was treated with passing a Foley catheter in the emergency room. As she cannot pass the stone and was significantly symptomatic with bladder outlet obstruction as well as bilateral hydronephrosis, it was recommended that the patient undergo cystolitholapaxy upon her visit yesterday. She presents for that procedure. I discussed the procedure of cystolitholapaxy, as well as risks and complications which include infection, bladder injury, anesthetic complications, among others. She understands these and desires to proceed.  Procedure: The patient was identified in the holding area and received preoperative IV antibiotics. She was taken to the operating room where general anesthetic was administered with the LMA. She was placed in the dorsolithotomy position. Genitalia and perineum were prepped and draped, timeout was performed.  A 23 French panendoscope was advanced into the bladder which was inspected circumferentially. Mild erythema of the posterior bladder wall consistent with catheter related trauma. There was one single large bladder stone which appeared similar in size to that measured on CT scan. No other bladder stones were noted. There were no bladder lesions. Ureteral orifices were normal in configuration and location.  Using the [place], I fragmented the stone into multiple smaller fragments. These were irrigated from the bladder with a [place] syringe. Several of the larger fragments had to be re-fragmented, and all of the fragments, including all the dust/powder, was then  irrigated carefully from the bladder. Inspection of the bladder following irrigation revealed no evident fragments remaining. At this point, the scope was removed. An 54 French Foley catheter was placed to dependent drainage with the balloon filled with 10 mL of water.  The patient tolerated procedure well and was returned to the PACU in stable condition.

## 2015-04-09 NOTE — Discharge Instructions (Signed)
1. You may see some blood in the urine and may have some burning with urination for 48-72 hours. You also may notice that you have to urinate more frequently or urgently after your procedure which is normal.  2. You should call should you develop an inability urinate, fever > 101, persistent nausea and vomiting that prevents you from eating or drinking to stay hydrated.  3. If you have a catheter, you will be taught how to take care of the catheter by the nursing staff prior to discharge from the hospital. It is okay to remove the catheter on Friday morning. You may periodically feel a strong urge to void with the catheter in place.  This is a bladder spasm and most often can occur when having a bowel movement or moving around. It is typically self-limited and usually will stop after a few minutes.  You may use some Vaseline or Neosporin around the tip of the catheter to reduce friction at the tip of the penis. You may also see some blood in the urine.  A very small amount of blood can make the urine look quite red.  As long as the catheter is draining well, there usually is not a problem.  However, if the catheter is not draining well and is bloody, you should call the office [phone]) to notify us.

## 2015-04-09 NOTE — Transfer of Care (Signed)
Immediate Anesthesia Transfer of Care Note  Patient: Melanie Reed  Procedure(s) Performed: Procedure(s): CYSTOSCOPY WITH LITHOLAPAXY WITH STONEBREAKER (N/A)  Patient Location: PACU  Anesthesia Type:General  Level of Consciousness: awake, alert , oriented and patient cooperative  Airway & Oxygen Therapy: Patient Spontanous Breathing and Patient connected to nasal cannula oxygen  Post-op Assessment: Report given to RN and Post -op Vital signs reviewed and stable  Post vital signs: Reviewed and stable 144/82 BP 74-20 SaO2 100%  Last Vitals:  Filed Vitals:   04/09/15 1016  BP: 172/97  Pulse: 94  Temp: 37 C  Resp: 16    Complications: No apparent anesthesia complications

## 2015-04-09 NOTE — Anesthesia Procedure Notes (Signed)
Procedure Name: LMA Insertion Date/Time: 04/09/2015 12:20 PM Performed by: Wanita Chamberlain Pre-anesthesia Checklist: Patient identified, Emergency Drugs available, Suction available, Patient being monitored and Timeout performed Patient Re-evaluated:Patient Re-evaluated prior to inductionOxygen Delivery Method: Circle system utilized Preoxygenation: Pre-oxygenation with 100% oxygen Intubation Type: IV induction Ventilation: Mask ventilation without difficulty LMA: LMA inserted LMA Size: 4.0 Number of attempts: 1 Placement Confirmation: breath sounds checked- equal and bilateral and positive ETCO2 Tube secured with: Tape Dental Injury: Teeth and Oropharynx as per pre-operative assessment

## 2015-04-09 NOTE — Anesthesia Preprocedure Evaluation (Addendum)
Anesthesia Evaluation  Patient identified by MRN, date of birth, ID band Patient awake    Reviewed: Allergy & Precautions, NPO status , Patient's Chart, lab work & pertinent test results  History of Anesthesia Complications Negative for: history of anesthetic complications  Airway Mallampati: II  TM Distance: >3 FB Neck ROM: Full    Dental  (+) Teeth Intact, Dental Advisory Given   Pulmonary Current Smoker,    Pulmonary exam normal breath sounds clear to auscultation       Cardiovascular hypertension (patient denies anti-hypertensive medications), (-) angina(-) CAD and (-) Past MI Normal cardiovascular exam Rhythm:Regular Rate:Normal     Neuro/Psych negative neurological ROS  negative psych ROS   GI/Hepatic negative GI ROS, (+)     substance abuse  cocaine use,   Endo/Other  negative endocrine ROS  Renal/GU negative Renal ROS   Bladder outlet obstruction, bladder stone     Musculoskeletal negative musculoskeletal ROS (+)   Abdominal   Peds  Hematology  (+) Blood dyscrasia, anemia ,   Anesthesia Other Findings Day of surgery medications reviewed with the patient.  Reproductive/Obstetrics negative OB ROS                            Anesthesia Physical Anesthesia Plan  ASA: II  Anesthesia Plan: General   Post-op Pain Management:    Induction: Intravenous  Airway Management Planned: LMA  Additional Equipment:   Intra-op Plan:   Post-operative Plan: Extubation in OR  Informed Consent: I have reviewed the patients History and Physical, chart, labs and discussed the procedure including the risks, benefits and alternatives for the proposed anesthesia with the patient or authorized representative who has indicated his/her understanding and acceptance.   Dental advisory given  Plan Discussed with: CRNA  Anesthesia Plan Comments: (Risks/benefits of general anesthesia discussed  with patient including risk of damage to teeth, lips, gum, and tongue, nausea/vomiting, allergic reactions to medications, and the possibility of heart attack, stroke and death.  All patient questions answered.  Patient wishes to proceed.)        Anesthesia Quick Evaluation

## 2015-04-09 NOTE — H&P (Signed)
  H&P  Chief Complaint: Bladder stone  History of Present Illness: Melanie Reed is a 50 y.o. year old female who presented to my office urgently yesterday, having been sent from the emergency room. She presented there earlier this week with bladder outlet obstruction and significant pelvic pain. The patient underwent CT the abdomen and pelvis without contrast. This revealed a large urethral calculus with bladder outlet obstruction and bilateral hydroureteronephrosis. She had placement of a Foley catheter, which was somewhat difficult because of the large urethral stone. She has had adequate bladder drainage since that time, but her only complaint at this time his bladder spasms from her indwelling catheter.  The patient was assessed yesterday. She does have a 15 x 9 mm stone that was in her urethra, now on her bladder. She presents for cystolitholapaxy.  Past Medical History  Diagnosis Date  . Hypertension   . Bladder stone   . Bladder outlet obstruction   . Foley catheter in place     Past Surgical History  Procedure Laterality Date  . Salpingoophorectomy  1980's    Unilateral    Home Medications:  No prescriptions prior to admission    Allergies: No Known Allergies  Family History  Problem Relation Age of Onset  . Cancer Mother     lung cancer  . Breast cancer Sister   . Seizures Sister     Social History:  reports that she has been smoking Cigarettes.  She has a 8 pack-year smoking history. She has never used smokeless tobacco. She reports that she uses illicit drugs ("Crack" cocaine). She reports that she does not drink alcohol.  ROS: A complete review of systems was performed.  All systems are negative except for pertinent findings as noted.  Physical Exam:  Vital signs in last 24 hours: Weight:  [69.4 kg (153 lb)] 69.4 kg (153 lb) (02/15 1712) General:  Alert and oriented, No acute distress HEENT: Normocephalic, atraumatic Neck: No JVD or  lymphadenopathy Cardiovascular: Regular rate and rhythm Lungs: Clear bilaterally Abdomen: Soft, nontender, nondistended, no abdominal masses Back: No CVA tenderness Extremities: No edema Neurologic: Grossly intact  Laboratory Data:  No results found for this or any previous visit (from the past 24 hour(s)). Recent Results (from the past 240 hour(s))  Urine culture     Status: None   Collection Time: 04/05/15  5:15 AM  Result Value Ref Range Status   Specimen Description URINE, RANDOM  Final   Special Requests NONE  Final   Culture MULTIPLE SPECIES PRESENT, SUGGEST RECOLLECTION  Final   Report Status 04/06/2015 FINAL  Final   Creatinine:  Recent Labs  04/05/15 0730  CREATININE 0.73    Radiologic Imaging: No results found.  Impression/Assessment:  Large bladder calculus, previously in the patient's urethra with subsequent bladder outlet obstruction with bilateral hydroureteronephrosis, now drained with Foley catheter  Plan:  Cystolitholapaxy. The procedure, as well as risks and complications including but not limited to bladder injury, bleeding, infection, anesthetic complications, were discussed with the patient and her boyfriend, Herbie Baltimore. They understand these and desired to proceed.  Jorja Loa 04/09/2015, 8:34 AM  Lillette Boxer. Nava Song MD

## 2015-04-09 NOTE — Anesthesia Postprocedure Evaluation (Signed)
Anesthesia Post Note  Patient: Melanie Reed  Procedure(s) Performed: Procedure(s) (LRB): CYSTOSCOPY WITH LITHOLAPAXY WITH STONEBREAKER (N/A)  Patient location during evaluation: PACU Anesthesia Type: General Level of consciousness: awake and alert Pain management: pain level controlled Vital Signs Assessment: post-procedure vital signs reviewed and stable Respiratory status: spontaneous breathing, nonlabored ventilation, respiratory function stable and patient connected to nasal cannula oxygen Cardiovascular status: blood pressure returned to baseline and stable Postop Assessment: no signs of nausea or vomiting Anesthetic complications: no    Last Vitals:  Filed Vitals:   04/09/15 1330 04/09/15 1352  BP: 167/82 162/89  Pulse: 61 56  Temp:  36.7 C  Resp: 12 16    Last Pain:  Filed Vitals:   04/09/15 1353  PainSc: 0-No pain                 Catalina Gravel

## 2015-04-10 ENCOUNTER — Encounter (HOSPITAL_BASED_OUTPATIENT_CLINIC_OR_DEPARTMENT_OTHER): Payer: Self-pay | Admitting: Urology

## 2016-06-16 ENCOUNTER — Other Ambulatory Visit (HOSPITAL_COMMUNITY)
Admission: RE | Admit: 2016-06-16 | Discharge: 2016-06-16 | Disposition: A | Discharge status: Home / Self Care | Payer: BLUE CROSS/BLUE SHIELD | Source: Ambulatory Visit | Attending: Internal Medicine | Admitting: Internal Medicine

## 2016-06-16 ENCOUNTER — Other Ambulatory Visit: Payer: Self-pay | Admitting: Internal Medicine

## 2016-06-16 DIAGNOSIS — Z01419 Encounter for gynecological examination (general) (routine) without abnormal findings: Secondary | ICD-10-CM | POA: Diagnosis present

## 2016-06-20 LAB — CYTOLOGY - PAP
Adequacy: ABSENT
Diagnosis: NEGATIVE

## 2016-07-15 ENCOUNTER — Other Ambulatory Visit: Payer: Self-pay | Admitting: Internal Medicine

## 2016-07-15 DIAGNOSIS — N63 Unspecified lump in unspecified breast: Secondary | ICD-10-CM

## 2016-07-22 ENCOUNTER — Ambulatory Visit
Admission: RE | Admit: 2016-07-22 | Discharge: 2016-07-22 | Disposition: A | Discharge status: Home / Self Care | Payer: BLUE CROSS/BLUE SHIELD | Source: Ambulatory Visit | Attending: Internal Medicine | Admitting: Internal Medicine

## 2016-07-22 DIAGNOSIS — N63 Unspecified lump in unspecified breast: Secondary | ICD-10-CM

## 2016-08-19 ENCOUNTER — Ambulatory Visit
Admission: RE | Admit: 2016-08-19 | Discharge: 2016-08-19 | Disposition: A | Discharge status: Home / Self Care | Payer: BLUE CROSS/BLUE SHIELD | Source: Ambulatory Visit | Attending: Internal Medicine | Admitting: Internal Medicine

## 2016-08-19 ENCOUNTER — Other Ambulatory Visit: Payer: Self-pay | Admitting: Internal Medicine

## 2016-08-19 DIAGNOSIS — M542 Cervicalgia: Secondary | ICD-10-CM

## 2017-06-27 ENCOUNTER — Other Ambulatory Visit: Payer: Self-pay | Admitting: Internal Medicine

## 2017-06-27 DIAGNOSIS — Z1231 Encounter for screening mammogram for malignant neoplasm of breast: Secondary | ICD-10-CM

## 2017-07-27 ENCOUNTER — Ambulatory Visit
Admission: RE | Admit: 2017-07-27 | Discharge: 2017-07-27 | Disposition: A | Discharge status: Home / Self Care | Payer: 59 | Source: Ambulatory Visit | Attending: Internal Medicine | Admitting: Internal Medicine

## 2017-07-27 DIAGNOSIS — Z1231 Encounter for screening mammogram for malignant neoplasm of breast: Secondary | ICD-10-CM | POA: Diagnosis not present

## 2017-12-02 DIAGNOSIS — Z23 Encounter for immunization: Secondary | ICD-10-CM | POA: Diagnosis not present

## 2018-01-29 ENCOUNTER — Ambulatory Visit
Admission: RE | Admit: 2018-01-29 | Discharge: 2018-01-29 | Disposition: A | Discharge status: Home / Self Care | Payer: 59 | Source: Ambulatory Visit | Attending: Internal Medicine | Admitting: Internal Medicine

## 2018-01-29 ENCOUNTER — Other Ambulatory Visit: Payer: Self-pay | Admitting: Internal Medicine

## 2018-01-29 DIAGNOSIS — I10 Essential (primary) hypertension: Secondary | ICD-10-CM | POA: Diagnosis not present

## 2018-01-29 DIAGNOSIS — M7062 Trochanteric bursitis, left hip: Secondary | ICD-10-CM | POA: Diagnosis not present

## 2018-01-29 DIAGNOSIS — R2231 Localized swelling, mass and lump, right upper limb: Secondary | ICD-10-CM

## 2018-01-29 DIAGNOSIS — M7989 Other specified soft tissue disorders: Secondary | ICD-10-CM | POA: Diagnosis not present

## 2018-02-13 DIAGNOSIS — Z0001 Encounter for general adult medical examination with abnormal findings: Secondary | ICD-10-CM | POA: Diagnosis not present

## 2018-02-13 DIAGNOSIS — Z6827 Body mass index (BMI) 27.0-27.9, adult: Secondary | ICD-10-CM | POA: Diagnosis not present

## 2018-02-13 DIAGNOSIS — M7062 Trochanteric bursitis, left hip: Secondary | ICD-10-CM | POA: Diagnosis not present

## 2018-04-11 DIAGNOSIS — T783XXA Angioneurotic edema, initial encounter: Secondary | ICD-10-CM | POA: Diagnosis not present

## 2018-04-11 DIAGNOSIS — I10 Essential (primary) hypertension: Secondary | ICD-10-CM | POA: Diagnosis not present

## 2018-05-10 DIAGNOSIS — T50905A Adverse effect of unspecified drugs, medicaments and biological substances, initial encounter: Secondary | ICD-10-CM | POA: Diagnosis not present

## 2018-05-10 DIAGNOSIS — Z720 Tobacco use: Secondary | ICD-10-CM | POA: Diagnosis not present

## 2018-05-10 DIAGNOSIS — I10 Essential (primary) hypertension: Secondary | ICD-10-CM | POA: Diagnosis not present

## 2018-05-10 DIAGNOSIS — L639 Alopecia areata, unspecified: Secondary | ICD-10-CM | POA: Diagnosis not present

## 2018-08-16 ENCOUNTER — Ambulatory Visit
Admission: RE | Admit: 2018-08-16 | Discharge: 2018-08-16 | Disposition: A | Discharge status: Home / Self Care | Payer: 59 | Source: Ambulatory Visit | Attending: Internal Medicine | Admitting: Internal Medicine

## 2018-08-16 ENCOUNTER — Other Ambulatory Visit: Payer: Self-pay | Admitting: Internal Medicine

## 2018-08-16 DIAGNOSIS — M25552 Pain in left hip: Secondary | ICD-10-CM

## 2018-08-29 ENCOUNTER — Other Ambulatory Visit: Payer: Self-pay | Admitting: Internal Medicine

## 2018-08-29 DIAGNOSIS — Z1231 Encounter for screening mammogram for malignant neoplasm of breast: Secondary | ICD-10-CM

## 2018-10-10 ENCOUNTER — Ambulatory Visit
Admission: RE | Admit: 2018-10-10 | Discharge: 2018-10-10 | Disposition: A | Discharge status: Home / Self Care | Payer: 59 | Source: Ambulatory Visit | Attending: Internal Medicine | Admitting: Internal Medicine

## 2018-10-10 ENCOUNTER — Other Ambulatory Visit: Payer: Self-pay

## 2018-10-10 DIAGNOSIS — Z1231 Encounter for screening mammogram for malignant neoplasm of breast: Secondary | ICD-10-CM

## 2019-02-28 ENCOUNTER — Other Ambulatory Visit: Payer: Self-pay | Admitting: Internal Medicine

## 2019-02-28 ENCOUNTER — Other Ambulatory Visit (HOSPITAL_COMMUNITY)
Admission: RE | Admit: 2019-02-28 | Discharge: 2019-02-28 | Disposition: A | Discharge status: Home / Self Care | Payer: 59 | Source: Ambulatory Visit | Attending: Internal Medicine | Admitting: Internal Medicine

## 2019-02-28 DIAGNOSIS — Z124 Encounter for screening for malignant neoplasm of cervix: Secondary | ICD-10-CM | POA: Diagnosis not present

## 2019-03-05 LAB — CYTOLOGY - PAP
Adequacy: ABSENT
Chlamydia: NEGATIVE
Comment: NEGATIVE
Comment: NEGATIVE
Comment: NORMAL
Diagnosis: NEGATIVE
High risk HPV: NEGATIVE
Neisseria Gonorrhea: NEGATIVE

## 2019-08-20 ENCOUNTER — Other Ambulatory Visit: Payer: Self-pay | Admitting: Internal Medicine

## 2019-08-20 DIAGNOSIS — Z1231 Encounter for screening mammogram for malignant neoplasm of breast: Secondary | ICD-10-CM

## 2019-10-14 ENCOUNTER — Other Ambulatory Visit: Payer: Self-pay

## 2019-10-14 ENCOUNTER — Ambulatory Visit
Admission: RE | Admit: 2019-10-14 | Discharge: 2019-10-14 | Disposition: A | Discharge status: Home / Self Care | Payer: 59 | Source: Ambulatory Visit | Attending: Internal Medicine | Admitting: Internal Medicine

## 2019-10-14 DIAGNOSIS — Z1231 Encounter for screening mammogram for malignant neoplasm of breast: Secondary | ICD-10-CM

## 2019-10-15 ENCOUNTER — Other Ambulatory Visit: Payer: Self-pay | Admitting: Internal Medicine

## 2019-10-15 DIAGNOSIS — R928 Other abnormal and inconclusive findings on diagnostic imaging of breast: Secondary | ICD-10-CM

## 2019-11-01 ENCOUNTER — Other Ambulatory Visit: Payer: Self-pay

## 2019-11-01 ENCOUNTER — Ambulatory Visit
Admission: RE | Admit: 2019-11-01 | Discharge: 2019-11-01 | Disposition: A | Discharge status: Home / Self Care | Payer: 59 | Source: Ambulatory Visit | Attending: Internal Medicine | Admitting: Internal Medicine

## 2019-11-01 ENCOUNTER — Other Ambulatory Visit: Payer: Self-pay | Admitting: Internal Medicine

## 2019-11-01 DIAGNOSIS — N6320 Unspecified lump in the left breast, unspecified quadrant: Secondary | ICD-10-CM

## 2019-11-01 DIAGNOSIS — R928 Other abnormal and inconclusive findings on diagnostic imaging of breast: Secondary | ICD-10-CM

## 2020-02-17 IMAGING — MG DIGITAL SCREENING BILATERAL MAMMOGRAM WITH CAD
4 series · 4 of 4 positions shown · non-contrast
Comparison: Previous exam(s).

CLINICAL DATA: Screening.

EXAM:
DIGITAL SCREENING BILATERAL MAMMOGRAM WITH CAD

[R CC]
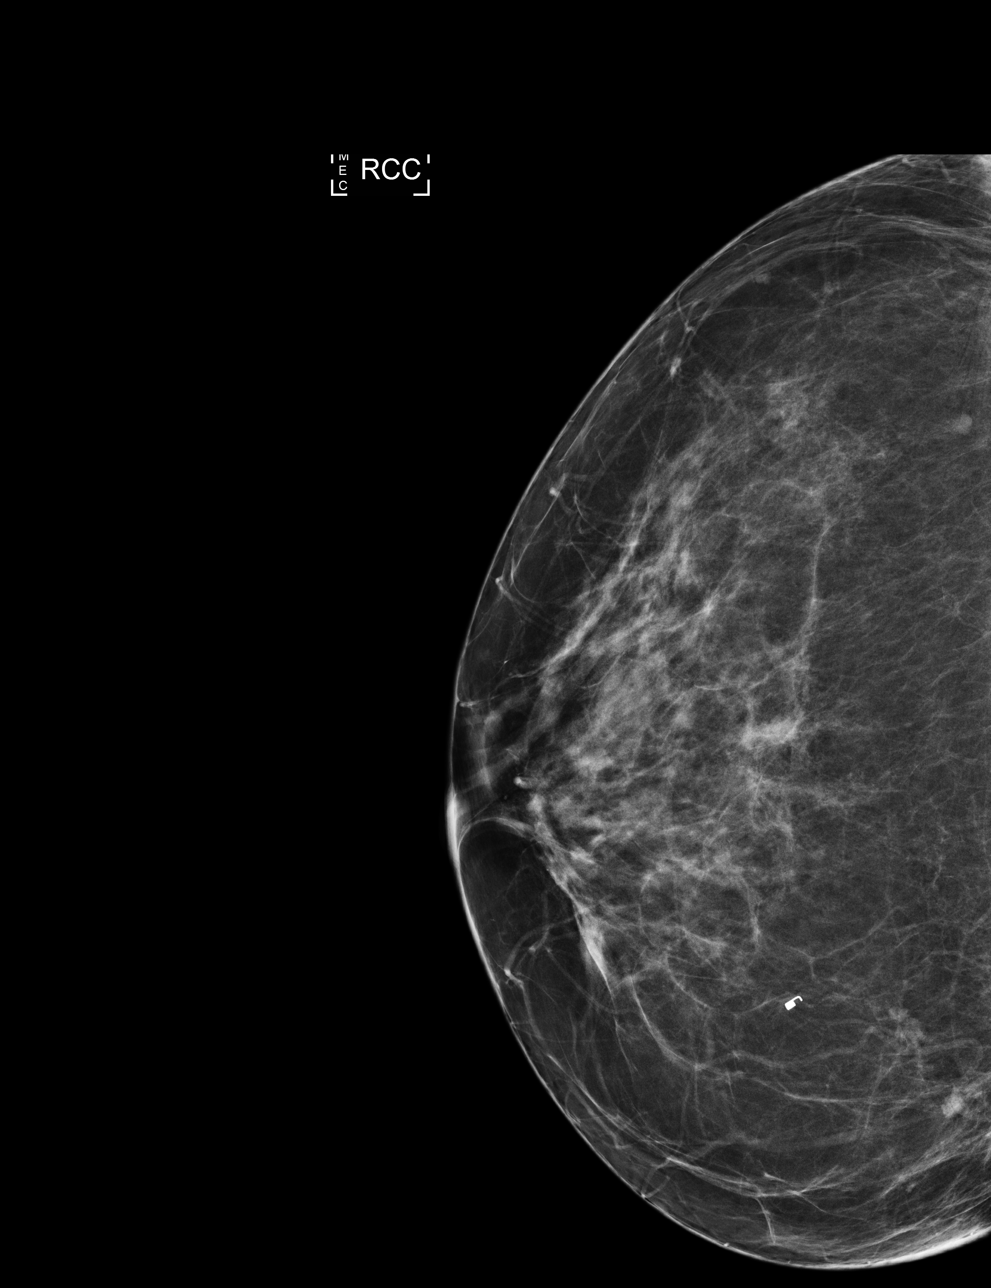

[L MLO]
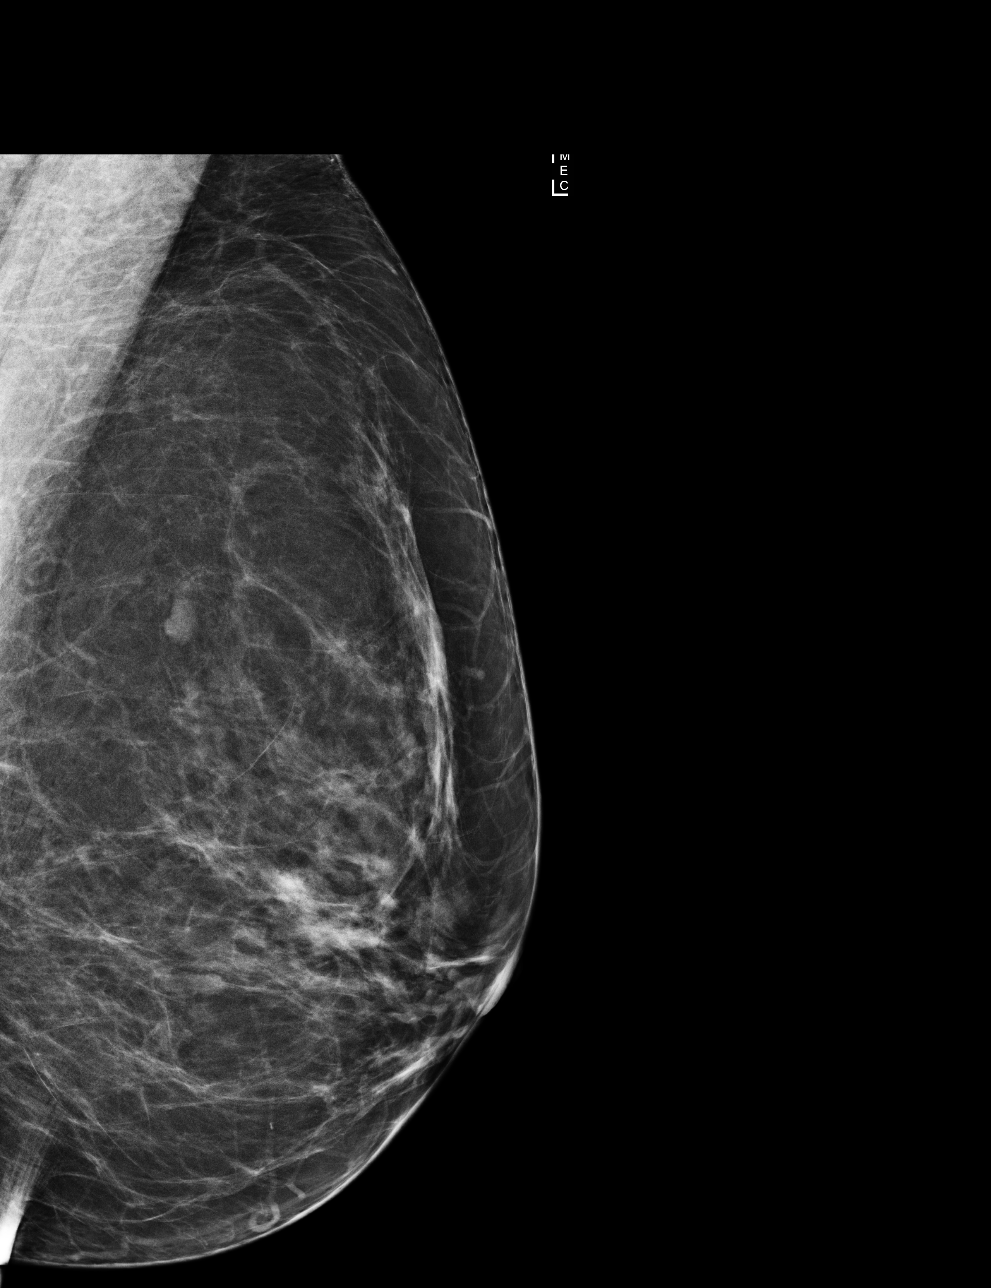

[R MLO]
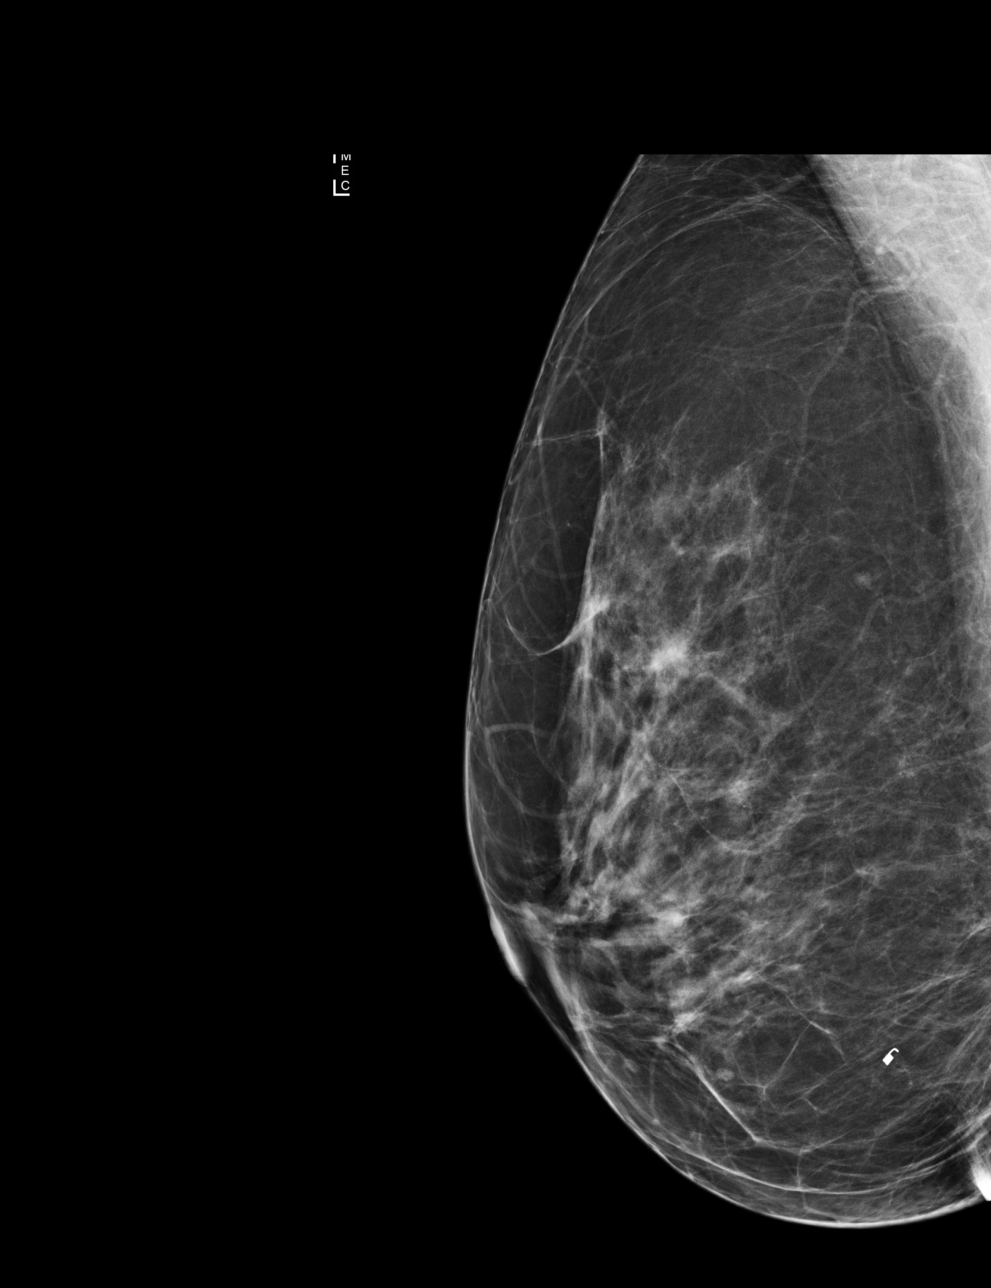

[L CC]
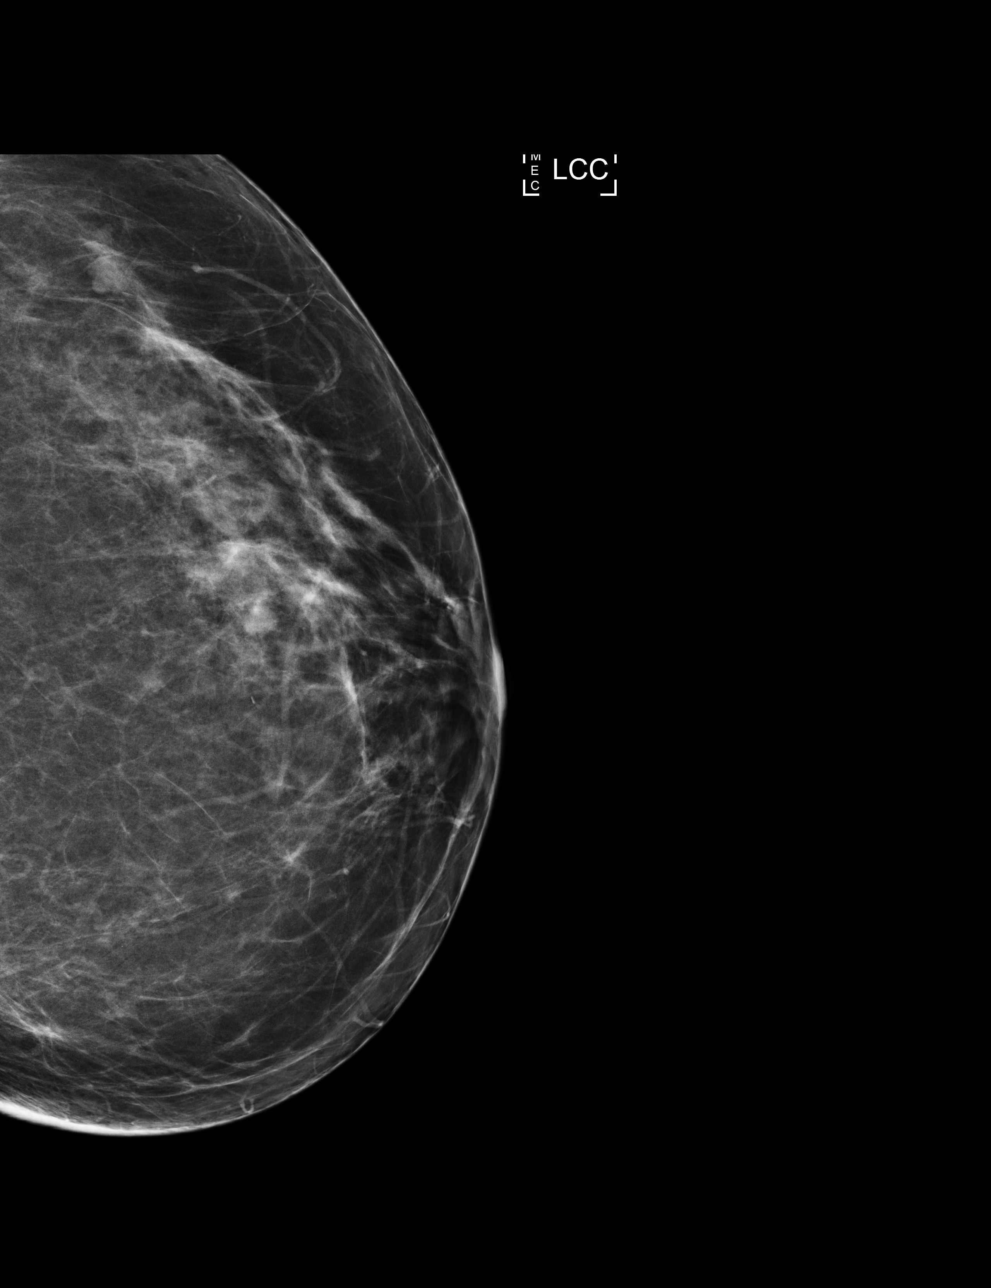

[4 of 4 positions shown; findings below may reference images not displayed]

ACR Breast Density Category b: There are scattered areas of
fibroglandular density.
FINDINGS: There are no findings suspicious for malignancy. Images were
processed with CAD.
IMPRESSION: No mammographic evidence of malignancy. A result letter of this
screening mammogram will be mailed directly to the patient.

RECOMMENDATION:
Screening mammogram in one year. (Code:AS-G-LCT)

BI-RADS CATEGORY  1: Negative.

## 2020-04-30 ENCOUNTER — Other Ambulatory Visit: Payer: Self-pay | Admitting: Family Medicine

## 2020-05-04 ENCOUNTER — Other Ambulatory Visit: Payer: Self-pay | Admitting: Internal Medicine

## 2020-05-04 ENCOUNTER — Other Ambulatory Visit: Payer: Self-pay

## 2020-05-04 ENCOUNTER — Ambulatory Visit
Admission: RE | Admit: 2020-05-04 | Discharge: 2020-05-04 | Disposition: A | Discharge status: Home / Self Care | Payer: 59 | Source: Ambulatory Visit | Attending: Internal Medicine | Admitting: Internal Medicine

## 2020-05-04 DIAGNOSIS — N6320 Unspecified lump in the left breast, unspecified quadrant: Secondary | ICD-10-CM

## 2020-08-20 ENCOUNTER — Ambulatory Visit (HOSPITAL_COMMUNITY): Payer: 59

## 2020-08-20 ENCOUNTER — Encounter (HOSPITAL_COMMUNITY): Payer: Self-pay | Admitting: Urgent Care

## 2020-08-20 ENCOUNTER — Ambulatory Visit (HOSPITAL_COMMUNITY)
Admission: EM | Admit: 2020-08-20 | Discharge: 2020-08-20 | Disposition: A | Discharge status: Home / Self Care | Payer: 59 | Attending: Urgent Care | Admitting: Urgent Care

## 2020-08-20 ENCOUNTER — Other Ambulatory Visit: Payer: Self-pay

## 2020-08-20 DIAGNOSIS — S63259A Unspecified dislocation of unspecified finger, initial encounter: Secondary | ICD-10-CM

## 2020-08-20 DIAGNOSIS — M79645 Pain in left finger(s): Secondary | ICD-10-CM | POA: Diagnosis not present

## 2020-08-20 MED ORDER — IBUPROFEN 800 MG PO TABS
ORAL_TABLET | ORAL | Status: AC
  Filled 2020-08-20: qty 1

## 2020-08-20 MED ORDER — NAPROXEN 500 MG PO TABS: 500.0000 mg | ORAL_TABLET | Freq: Two times a day (BID) | ORAL | 0 refills | Status: DC

## 2020-08-20 MED ORDER — IBUPROFEN 800 MG PO TABS
800.0000 mg | ORAL_TABLET | Freq: Once | ORAL | Status: AC
  Administered 2020-08-20: 800 mg via ORAL
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
## 2020-08-20 NOTE — Discharge Instructions (Addendum)
Keep the finger splint on.  Use naproxen for pain and inflammation.  Follow-up with [facility].

## 2020-10-23 ENCOUNTER — Other Ambulatory Visit: Payer: Self-pay | Admitting: Urgent Care

## 2020-10-28 ENCOUNTER — Other Ambulatory Visit: Payer: Self-pay | Admitting: Family Medicine

## 2020-10-28 ENCOUNTER — Other Ambulatory Visit: Payer: Self-pay | Admitting: Urgent Care

## 2020-10-28 DIAGNOSIS — N6320 Unspecified lump in the left breast, unspecified quadrant: Secondary | ICD-10-CM

## 2020-10-29 ENCOUNTER — Other Ambulatory Visit: Payer: 59

## 2020-10-29 ENCOUNTER — Other Ambulatory Visit: Payer: Self-pay

## 2020-10-29 ENCOUNTER — Ambulatory Visit
Admission: RE | Admit: 2020-10-29 | Discharge: 2020-10-29 | Disposition: A | Discharge status: Home / Self Care | Payer: 59 | Source: Ambulatory Visit | Attending: Family Medicine | Admitting: Family Medicine

## 2020-10-29 DIAGNOSIS — N6320 Unspecified lump in the left breast, unspecified quadrant: Secondary | ICD-10-CM

## 2021-07-13 ENCOUNTER — Encounter: Payer: Self-pay | Admitting: Family Medicine

## 2021-08-02 ENCOUNTER — Other Ambulatory Visit: Payer: Self-pay | Admitting: Physician Assistant

## 2021-08-02 DIAGNOSIS — Z720 Tobacco use: Secondary | ICD-10-CM

## 2021-08-02 DIAGNOSIS — Z0000 Encounter for general adult medical examination without abnormal findings: Secondary | ICD-10-CM

## 2021-08-02 DIAGNOSIS — Z122 Encounter for screening for malignant neoplasm of respiratory organs: Secondary | ICD-10-CM

## 2021-09-03 ENCOUNTER — Ambulatory Visit
Admission: RE | Admit: 2021-09-03 | Discharge: 2021-09-03 | Disposition: A | Discharge status: Home / Self Care | Payer: 59 | Source: Ambulatory Visit | Attending: Physician Assistant | Admitting: Physician Assistant

## 2021-09-03 DIAGNOSIS — Z122 Encounter for screening for malignant neoplasm of respiratory organs: Secondary | ICD-10-CM

## 2021-09-03 DIAGNOSIS — Z720 Tobacco use: Secondary | ICD-10-CM

## 2021-09-03 DIAGNOSIS — Z0000 Encounter for general adult medical examination without abnormal findings: Secondary | ICD-10-CM

## 2021-10-14 ENCOUNTER — Other Ambulatory Visit: Payer: Self-pay | Admitting: Family Medicine

## 2021-10-14 DIAGNOSIS — Z1231 Encounter for screening mammogram for malignant neoplasm of breast: Secondary | ICD-10-CM

## 2021-11-04 ENCOUNTER — Ambulatory Visit
Admission: RE | Admit: 2021-11-04 | Discharge: 2021-11-04 | Disposition: A | Discharge status: Home / Self Care | Payer: 59 | Source: Ambulatory Visit | Attending: Family Medicine | Admitting: Family Medicine

## 2021-11-04 DIAGNOSIS — Z1231 Encounter for screening mammogram for malignant neoplasm of breast: Secondary | ICD-10-CM

## 2022-03-10 LAB — HM PAP SMEAR: CHL HPV: NEGATIVE

## 2022-07-20 ENCOUNTER — Other Ambulatory Visit: Payer: Self-pay | Admitting: Family Medicine

## 2022-07-20 DIAGNOSIS — Z1231 Encounter for screening mammogram for malignant neoplasm of breast: Secondary | ICD-10-CM

## 2022-09-23 ENCOUNTER — Ambulatory Visit
Admission: RE | Admit: 2022-09-23 | Discharge: 2022-09-23 | Disposition: A | Discharge status: Home / Self Care | Payer: 59 | Source: Ambulatory Visit

## 2022-09-23 ENCOUNTER — Ambulatory Visit: Payer: Self-pay

## 2022-09-23 VITALS — BP 156/81 | HR 68 | Temp 98.1°F | Resp 18 | Ht 63.0 in | Wt 166.0 lb

## 2022-09-23 DIAGNOSIS — I10 Essential (primary) hypertension: Secondary | ICD-10-CM

## 2022-09-23 MED ORDER — SPIRONOLACTONE 25 MG PO TABS: 25.0000 mg | ORAL_TABLET | Freq: Every day | ORAL | 0 refills | Status: DC

## 2022-09-23 MED ORDER — HYDROCHLOROTHIAZIDE 25 MG PO TABS: 25.0000 mg | ORAL_TABLET | Freq: Every day | ORAL | 0 refills | Status: DC

## 2022-09-23 NOTE — ED Provider Notes (Signed)
[facility]    CSN: [id number] Arrival date & time: 09/23/22  1739      History   Chief Complaint Chief Complaint  Patient presents with   Headache    Out of blood pressure medicine - Entered by patient    HPI Melanie Reed is a 57 y.o. female.   Patient here today for refill of blood pressure medications.  She reports that she has been taking spironolactone/hydrochlorothiazide daily without concerns or side effects.  She has had some recent headaches from not having medication.  She states her last dose of spironolactone was yesterday.  She denies any chest pain or shortness of breath.  She has not any swelling of her legs.  She does have appointment with primary care next month.  The history is provided by the patient.  Headache Associated symptoms: no fever, no nausea and no vomiting     Past Medical History:  Diagnosis Date   Bladder outlet obstruction    Bladder stone    Foley catheter in place    Hypertension     There are no problems to display for this patient.   Past Surgical History:  Procedure Laterality Date   BREAST BIOPSY Right 10/17/2014   Stereo- Benign   CYSTOSCOPY WITH LITHOLAPAXY N/A 04/09/2015   Procedure: CYSTOSCOPY WITH LITHOLAPAXY WITH Lavonna Monarch;  Surgeon: Marcine Matar, MD;  Location: [facility];  Service: Urology;  Laterality: N/A;   SALPINGOOPHORECTOMY  1980's   Unilateral    OB History     Gravida  3   Para  2   Term  2   Preterm      AB  1   Living  2      SAB      IAB  1   Ectopic      Multiple      Live Births               Home Medications    Prior to Admission medications   Medication Sig Start Date End Date Taking? Authorizing Provider  hydrochlorothiazide (HYDRODIURIL) 25 MG tablet Take 1 tablet (25 mg total) by mouth daily. Last dose: Thursday. 09/23/22   Tomi Bamberger, PA-C  solifenacin (VESICARE) 10 MG tablet Take 10 mg by mouth daily. Samples given in office  x 7 days use    [provider]  spironolactone (ALDACTONE) 25 MG tablet Take 1 tablet (25 mg total) by mouth daily. Last dose: Thursday 09/23/22   Tomi Bamberger, PA-C    Family History Family History  Problem Relation Age of Onset   Cancer Mother        lung cancer   Seizures Sister    Breast cancer Sister 55   Breast cancer Sister 75    Social History Social History   Tobacco Use   Smoking status: Every Day    Current packs/day: 0.25    Average packs/day: 0.3 packs/day for 32.0 years (8.0 ttl pk-yrs)    Types: Cigarettes   Smokeless tobacco: Never  Vaping Use   Vaping status: Never Used  Substance Use Topics   Alcohol use: No   Drug use: Yes    Types: "Crack" cocaine    Comment: no drug use for 5 years--  04-08-2015  at pre-op,  pt denies any hx of drug use     Allergies   Diltiazem hcl, Lisinopril-hydrochlorothiazide, and Hydrochlorothiazide w-triamterene   Review of Systems Review of Systems  Constitutional:  Negative for chills and fever.  Eyes:  Negative for discharge, redness and visual disturbance.  Respiratory:  Negative for shortness of breath.   Cardiovascular:  Negative for chest pain.  Gastrointestinal:  Negative for nausea and vomiting.  Neurological:  Positive for headaches.     Physical Exam Triage Vital Signs ED Triage Vitals  Encounter Vitals Group     BP 09/23/22 1754 (!) 163/88     Systolic BP Percentile --      Diastolic BP Percentile --      Pulse Rate 09/23/22 1754 68     Resp 09/23/22 1754 18     Temp 09/23/22 1754 98.1 F (36.7 C)     Temp Source 09/23/22 1754 Oral     SpO2 09/23/22 1754 97 %     Weight 09/23/22 1751 166 lb (75.3 kg)     Height 09/23/22 1751 5\' 3"  (1.6 m)     Head Circumference --      Peak Flow --      Pain Score 09/23/22 1750 2     Pain Loc --      Pain Education --      Exclude from Growth Chart --    No data found.  Updated Vital Signs BP (!) 156/81 (BP Location: Right Arm)   Pulse 68    Temp 98.1 F (36.7 C) (Oral)   Resp 18   Ht 5\' 3"  (1.6 m)   Wt 166 lb (75.3 kg)   LMP 04/17/2010   SpO2 97%   BMI 29.41 kg/m   Physical Exam Vitals and nursing note reviewed.  Constitutional:      General: She is not in acute distress.    Appearance: Normal appearance. She is not ill-appearing.  HENT:     Head: Normocephalic and atraumatic.  Eyes:     Conjunctiva/sclera: Conjunctivae normal.  Cardiovascular:     Rate and Rhythm: Normal rate and regular rhythm.     Heart sounds: Normal heart sounds.  Pulmonary:     Effort: Pulmonary effort is normal. No respiratory distress.     Breath sounds: Normal breath sounds. No wheezing, rhonchi or rales.  Musculoskeletal:     Right lower leg: No edema.     Left lower leg: No edema.  Neurological:     Mental Status: She is alert.  Psychiatric:        Mood and Affect: Mood normal.        Behavior: Behavior normal.        Thought Content: Thought content normal.      UC Treatments / Results  Labs (all labs ordered are listed, but only abnormal results are displayed) Labs Reviewed - No data to display  EKG   Radiology No results found.  Procedures Procedures (including critical care time)  Medications Ordered in UC Medications - No data to display  Initial Impression / Assessment and Plan / UC Course  I have reviewed the triage vital signs and the nursing notes.  Pertinent labs & imaging results that were available during my care of the patient were reviewed by me and considered in my medical decision making (see chart for details).    Hydrochlorothiazide and spironolactone refilled as requested. Recommended follow up with Primary Care as scheduled with sooner follow up with any further concerns.   Final Clinical Impressions(s) / UC Diagnoses   Final diagnoses:  Essential hypertension   Discharge Instructions   None    ED Prescriptions  Medication Sig Dispense Auth. Provider   spironolactone (ALDACTONE)  25 MG tablet Take 1 tablet (25 mg total) by mouth daily. Last dose: Thursday 45 tablet Erma Pinto F, PA-C   hydrochlorothiazide (HYDRODIURIL) 25 MG tablet Take 1 tablet (25 mg total) by mouth daily. Last dose: Thursday. 45 tablet Tomi Bamberger, PA-C      PDMP not reviewed this encounter.   Tomi Bamberger, PA-C 09/23/22 [phone]

## 2022-09-23 NOTE — ED Triage Notes (Signed)
"  I ran out of my BP medication and I have been having headaches". "I have an appointment with a new primary care provider coming soon".

## 2022-09-23 NOTE — Telephone Encounter (Signed)
Chief Complaint: Headache Symptoms: moderate headache, lightheaded Frequency: comes and goes Pertinent Negatives: Patient denies chest pain, SOB, nausea, vomiting Disposition: [] ED /[] Urgent Care (no appt availability in office) / [] Appointment(In office/virtual)/ []  [facility]/ [] Home Care/ [] Refused Recommended Disposition /[] [facility]/ []  Follow-up with PCP Additional Notes: Patient stated she has been out of her blood pressure medication for 4 days and has developed a headace. Onset yesterday. Patient also reports feeling lightheaded at times and needs to sit down to rest. Patient is in between PCPs right now and needs a refill on blood pressure medication. Advised patient that she would need to be evaluated. Patient is agreeable and has been scheduled today at UC at 1800.   Reason for Disposition  [1] New headache AND [2] age > 52  Answer Assessment - Initial Assessment Questions 1. LOCATION: "Where does it hurt?"      Front of my head on the right side  2. ONSET: "When did the headache start?" (Minutes, hours or days)      Yesterday  3. PATTERN: "Does the pain come and go, or has it been constant since it started?"     Comes and goes  4. SEVERITY: "How bad is the pain?" and "What does it keep you from doing?"  (e.g., Scale 1-10; mild, moderate, or severe)   - MILD (1-3): doesn't interfere with normal activities    - MODERATE (4-7): interferes with normal activities or awakens from sleep    - SEVERE (8-10): excruciating pain, unable to do any normal activities        7/10 5. RECURRENT SYMPTOM: "Have you ever had headaches before?" If Yes, ask: "When was the last time?" and "What happened that time?"      No  6. CAUSE: "What do you think is causing the headache?"     I've been out of my blood pressure medication for about 4 days 7. MIGRAINE: "Have you been diagnosed with migraine headaches?" If Yes, ask: "Is this headache similar?"      No  8. HEAD INJURY:  "Has there been any recent injury to the head?"      No 9. OTHER SYMPTOMS: "Do you have any other symptoms?" (fever, stiff neck, eye pain, sore throat, cold symptoms)     Lightheaded  Protocols used: Headache-A-AH

## 2022-10-31 NOTE — Progress Notes (Signed)
Erroneous encounter-disregard

## 2022-11-02 ENCOUNTER — Other Ambulatory Visit (INDEPENDENT_AMBULATORY_CARE_PROVIDER_SITE_OTHER): Payer: Self-pay | Admitting: Family

## 2022-11-02 ENCOUNTER — Ambulatory Visit (INDEPENDENT_AMBULATORY_CARE_PROVIDER_SITE_OTHER): Payer: Self-pay

## 2022-11-02 ENCOUNTER — Encounter: Payer: Self-pay | Admitting: Family

## 2022-11-02 ENCOUNTER — Encounter: Payer: 59 | Admitting: Family

## 2022-11-02 DIAGNOSIS — I10 Essential (primary) hypertension: Secondary | ICD-10-CM

## 2022-11-02 DIAGNOSIS — Z13220 Encounter for screening for lipoid disorders: Secondary | ICD-10-CM

## 2022-11-02 DIAGNOSIS — Z131 Encounter for screening for diabetes mellitus: Secondary | ICD-10-CM

## 2022-11-02 DIAGNOSIS — K219 Gastro-esophageal reflux disease without esophagitis: Secondary | ICD-10-CM

## 2022-11-02 DIAGNOSIS — Z7689 Persons encountering health services in other specified circumstances: Secondary | ICD-10-CM

## 2022-11-02 MED ORDER — SPIRONOLACTONE 25 MG PO TABS: 25.0000 mg | ORAL_TABLET | Freq: Every day | ORAL | 0 refills | Status: DC

## 2022-11-02 MED ORDER — OMEPRAZOLE 20 MG PO CPDR
20.0000 mg | DELAYED_RELEASE_CAPSULE | Freq: Every day | ORAL | 0 refills | Status: DC
Start: 2022-11-02 — End: 2023-02-06

## 2022-11-02 MED ORDER — HYDROCHLOROTHIAZIDE 50 MG PO TABS: 50.0000 mg | ORAL_TABLET | Freq: Every day | ORAL | 0 refills | Status: DC

## 2022-11-02 NOTE — Progress Notes (Signed)
Subjective:    Melanie Reed - 57 y.o. female MRN [id number]  Date of birth: June 09, 1965  HPI  Melanie Reed is to establish care.  Current issues and/or concerns: - Doing well on Hydrochlorothiazide and Spironolactone, no issues/concerns. Reports she has not taken blood pressure medications in 5 days due to needing refills. Home blood pressures above goal. She does monitor salt intake. She does exercise. She does not complain of red flag symptoms such as but not limited to chest pain, shortness of breath, worst headache of life, nausea/vomiting. She requests a chest xray due to intermittent chest discomfort while at work. Reports history of allergies to Lisinopril-Hydrochlorothiazide and Hydrochlorothiazide with Triamterene causing hair loss.  - Acid reflux. States causing "air bubbles". She denies red flag symptoms.  - No further issues/concerns for discussion today.    ROS per HPI    Health Maintenance:  Health Maintenance Due  Topic Date Due   HIV Screening  Never done   Hepatitis C Screening  Never done   DTaP/Tdap/Td (1 - Tdap) Never done   Colonoscopy  Never done   Zoster Vaccines- Shingrix (1 of 2) Never done   INFLUENZA VACCINE  09/22/2022   COVID-19 Vaccine (3 - 2023-24 season) 10/23/2022     Past Medical History: There are no problems to display for this patient.     Social History   reports that she has been smoking cigarettes. She has a 8 pack-year smoking history. She has never used smokeless tobacco. She reports current drug use. Drug: "Crack" cocaine. She reports that she does not drink alcohol.   Family History  family history includes Breast cancer (age of onset: 50) in her sister; Breast cancer (age of onset: 90) in her sister; Cancer in her mother; Seizures in her sister.   Medications: reviewed and updated   Objective:      11/02/2022    8:09 AM 09/23/2022    5:56 PM 09/23/2022    5:54 PM  Vitals with BMI  Height 5' 2.5"    Weight 169 lbs 3  oz    BMI 30.43    Systolic 159 156 409  Diastolic 90 81 88  Pulse 75  68   Physical Exam HENT:     Head: Normocephalic and atraumatic.     Nose: Nose normal.     Mouth/Throat:     Mouth: Mucous membranes are moist.     Pharynx: Oropharynx is clear.  Eyes:     Extraocular Movements: Extraocular movements intact.     Conjunctiva/sclera: Conjunctivae normal.     Pupils: Pupils are equal, round, and reactive to light.  Cardiovascular:     Rate and Rhythm: Normal rate and regular rhythm.     Pulses: Normal pulses.     Heart sounds: Normal heart sounds.  Pulmonary:     Effort: Pulmonary effort is normal.     Breath sounds: Normal breath sounds.  Musculoskeletal:        General: Normal range of motion.     Cervical back: Normal range of motion and neck supple.  Neurological:     General: No focal deficit present.     Mental Status: She is alert and oriented to person, place, and time.  Psychiatric:        Mood and Affect: Mood normal.        Behavior: Behavior normal.        Assessment & Plan:  1. Encounter to establish care - Patient presents today  to establish care. During the interim follow-up with primary provider as scheduled.  - Return for annual physical examination, labs, and health maintenance. Arrive fasting meaning having no food for at least 8 hours prior to appointment. You may have only water or black coffee. Please take scheduled medications as normal.  2. Primary hypertension - Blood pressure not at goal during today's visit. Patient asymptomatic without chest pressure, chest pain, palpitations, shortness of breath, worst headache of life, and any additional red flag symptoms. - Increase Hydrochlorothiazide from 25 mg daily to 50 mg daily.  - Continue Spironolactone as prescribed.  - Routine screening.  - Counseled on blood pressure goal of less than 130/80, low-sodium, DASH diet, medication compliance, and 150 minutes of moderate intensity exercise per week  as tolerated. Counseled on medication adherence and adverse effects. - Follow-up with primary provider in 2 weeks or sooner if needed for blood pressure check. - hydrochlorothiazide (HYDRODIURIL) 50 MG tablet; Take 1 tablet (50 mg total) by mouth daily.  Dispense: 90 tablet; Refill: 0 - spironolactone (ALDACTONE) 25 MG tablet; Take 1 tablet (25 mg total) by mouth daily.  Dispense: 90 tablet; Refill: 0 - CMP14+EGFR - DG Chest 2 View; Future  3. Diabetes mellitus screening - Routine screening.  - Hemoglobin A1c  4. Screening cholesterol level - Routine screening.  - Lipid panel  5. Gastroesophageal reflux disease, unspecified whether esophagitis present - Omeprazole as prescribed. Counseled on medication adherence. - Follow-up with primary provider in 4 weeks or sooner if needed.  - omeprazole (PRILOSEC) 20 MG capsule; Take 1 capsule (20 mg total) by mouth daily.  Dispense: 90 capsule; Refill: 0    Patient was given clear instructions to go to Emergency Department or return to medical center if symptoms don't improve, worsen, or new problems develop.The patient verbalized understanding.  I discussed the assessment and treatment plan with the patient. The patient was provided an opportunity to ask questions and all were answered. The patient agreed with the plan and demonstrated an understanding of the instructions.   The patient was advised to call back or seek an in-person evaluation if the symptoms worsen or if the condition fails to improve as anticipated.    Ricky Stabs, NP 11/02/2022, 8:34 AM Primary Care at [facility]

## 2022-11-02 NOTE — Progress Notes (Signed)
Patient states she is here so she can get on her blood pressure medication.   States pain in middle of chest and says she has to sit down because it makes her dizzy.

## 2022-11-03 LAB — LIPID PANEL
Chol/HDL Ratio: 2.9 ratio (ref 0.0–4.4)
Cholesterol, Total: 184 mg/dL (ref 100–199)
HDL: 64 mg/dL (ref >39)
LDL Chol Calc (NIH): 101 mg/dL — ABNORMAL HIGH (ref 0–99)
Triglycerides: 106 mg/dL (ref 0–149)
VLDL Cholesterol Cal: 19 mg/dL (ref 5–40)

## 2022-11-03 LAB — CMP14+EGFR
ALT: 12 IU/L (ref 0–32)
AST: 15 IU/L (ref 0–40)
Albumin: 4.2 g/dL (ref 3.8–4.9)
Alkaline Phosphatase: 85 IU/L (ref 44–121)
BUN/Creatinine Ratio: 21 (ref 9–23)
BUN: 15 mg/dL (ref 6–24)
Bilirubin Total: <0.2 mg/dL (ref 0.0–1.2)
CO2: 22 mmol/L (ref 20–29)
Calcium: 9.3 mg/dL (ref 8.7–10.2)
Chloride: 104 mmol/L (ref 96–106)
Creatinine, Ser: 0.73 mg/dL (ref 0.57–1.00)
Globulin, Total: 2.4 g/dL (ref 1.5–4.5)
Glucose: 71 mg/dL (ref 70–99)
Potassium: 4.8 mmol/L (ref 3.5–5.2)
Sodium: 143 mmol/L (ref 134–144)
Total Protein: 6.6 g/dL (ref 6.0–8.5)
eGFR: 95 mL/min/{1.73_m2} (ref >59)

## 2022-11-03 LAB — HEMOGLOBIN A1C
Est. average glucose Bld gHb Est-mCnc: 134 mg/dL
Hgb A1c MFr Bld: 6.3 % — ABNORMAL HIGH (ref 4.8–5.6)

## 2022-12-20 ENCOUNTER — Other Ambulatory Visit: Payer: Self-pay | Admitting: Family

## 2022-12-20 ENCOUNTER — Other Ambulatory Visit: Payer: Self-pay

## 2022-12-20 DIAGNOSIS — I10 Essential (primary) hypertension: Secondary | ICD-10-CM

## 2022-12-20 MED ORDER — SPIRONOLACTONE 25 MG PO TABS: 25.0000 mg | ORAL_TABLET | Freq: Every day | ORAL | 0 refills | Status: DC

## 2023-02-03 ENCOUNTER — Other Ambulatory Visit: Payer: Self-pay | Admitting: Family

## 2023-02-03 DIAGNOSIS — I10 Essential (primary) hypertension: Secondary | ICD-10-CM

## 2023-02-03 DIAGNOSIS — K219 Gastro-esophageal reflux disease without esophagitis: Secondary | ICD-10-CM

## 2023-02-06 NOTE — Telephone Encounter (Signed)
 Complete. Schedule appointment.

## 2023-04-06 ENCOUNTER — Other Ambulatory Visit: Payer: Self-pay | Admitting: Family

## 2023-04-06 DIAGNOSIS — K219 Gastro-esophageal reflux disease without esophagitis: Secondary | ICD-10-CM

## 2023-04-06 NOTE — Telephone Encounter (Signed)
Requested medication (s) are due for refill today: na  Requested medication (s) are on the active medication list: yes  Last refill:  02/06/23 #90 0 refills  Future visit scheduled: yes 04/25/23  Notes to clinic:  do you want to refill Rx? No OV noted to establish care.     Requested Prescriptions  Pending Prescriptions Disp Refills   omeprazole (PRILOSEC) 20 MG capsule 90 capsule 0     Gastroenterology: Proton Pump Inhibitors Passed - 04/06/2023  3:41 PM      Passed - Valid encounter within last 12 months    Recent Outpatient Visits   None

## 2023-04-06 NOTE — Telephone Encounter (Signed)
Copied from CRM [id number]. Topic: Clinical - Medication Refill >> Apr 06, 2023 12:22 PM Phill Myron wrote: Most Recent Primary Care Visit:  Provider: Rema Fendt  Department: [facility]  Visit Type: NEW PATIENT  Date: 11/02/2022 omeprazole (PRILOSEC) 20 MG capsule Medication:   Has the patient contacted their pharmacy? No (Agent: If no, request that the patient contact the pharmacy for the refill. If patient does not wish to contact the pharmacy document the reason why and proceed with request.) (Agent: If yes, when and what did the pharmacy advise?)  Is this the correct pharmacy for this prescription? Yes If no, delete pharmacy and type the correct one.  This is the patient's preferred pharmacy:  [facility] [address] [place] - [address] [place] [address] Phone: [phone] Fax: [phone]   Has the prescription been filled recently? No  Is the patient out of the medication? Yes  Has the patient been seen for an appointment in the last year OR does the patient have an upcoming appointment? Yes  Can we respond through MyChart? Yes  Agent: Please be advised that Rx refills may take up to 3 business days. We ask that you follow-up with your pharmacy.

## 2023-04-07 MED ORDER — OMEPRAZOLE 20 MG PO CPDR: 20.0000 mg | DELAYED_RELEASE_CAPSULE | Freq: Every day | ORAL | 0 refills | Status: DC

## 2023-04-07 NOTE — Telephone Encounter (Signed)
Will forward to correct nurse

## 2023-04-25 ENCOUNTER — Ambulatory Visit: Payer: Self-pay | Admitting: Family

## 2023-05-15 ENCOUNTER — Other Ambulatory Visit: Payer: Self-pay | Admitting: Family

## 2023-05-15 DIAGNOSIS — I10 Essential (primary) hypertension: Secondary | ICD-10-CM

## 2023-05-19 ENCOUNTER — Ambulatory Visit (INDEPENDENT_AMBULATORY_CARE_PROVIDER_SITE_OTHER): Payer: Self-pay | Admitting: Family

## 2023-05-19 VITALS — BP 136/84 | HR 73 | Temp 98.1°F | Ht 63.0 in | Wt 171.2 lb

## 2023-05-19 DIAGNOSIS — L659 Nonscarring hair loss, unspecified: Secondary | ICD-10-CM | POA: Diagnosis not present

## 2023-05-19 DIAGNOSIS — M25511 Pain in right shoulder: Secondary | ICD-10-CM

## 2023-05-19 DIAGNOSIS — I10 Essential (primary) hypertension: Secondary | ICD-10-CM | POA: Diagnosis not present

## 2023-05-19 DIAGNOSIS — M25512 Pain in left shoulder: Secondary | ICD-10-CM

## 2023-05-19 DIAGNOSIS — K219 Gastro-esophageal reflux disease without esophagitis: Secondary | ICD-10-CM

## 2023-05-19 MED ORDER — OMEPRAZOLE 20 MG PO CPDR
20.0000 mg | DELAYED_RELEASE_CAPSULE | Freq: Every day | ORAL | 0 refills | Status: DC
Start: 2023-05-19 — End: 2023-08-18

## 2023-05-19 MED ORDER — HYDROCHLOROTHIAZIDE 50 MG PO TABS: 50.0000 mg | ORAL_TABLET | Freq: Every day | ORAL | 0 refills | Status: DC

## 2023-05-19 MED ORDER — GABAPENTIN 300 MG PO CAPS: 300.0000 mg | ORAL_CAPSULE | Freq: Every day | ORAL | 1 refills | Status: DC

## 2023-05-19 MED ORDER — SPIRONOLACTONE 25 MG PO TABS: 25.0000 mg | ORAL_TABLET | Freq: Every day | ORAL | 0 refills | Status: DC

## 2023-05-19 NOTE — Progress Notes (Signed)
 Patient wants to know why both of her arms are going numb, has to raise them of shake them to wake them up.   Patient wants you to take over famotidine medication.   Patient has medications and doesn't know which one she needs to be taking, can you please advise her.   Wants Shingles vaccine., Wants Flu vcaccine.

## 2023-05-19 NOTE — Progress Notes (Signed)
 Patient ID: Melanie Reed, female    DOB: [birth year]  MRN: [id number]  CC: Chronic Conditions Follow-Up  Subjective: Liticia Gasior is a 58 y.o. female who presents for chronic conditions follow-up.   Her concerns today include:  - Doing well on Hydrochlorothiazide and Spironolactone. She does not complain of red flag symptoms such as but not limited to chest pain, shortness of breath, worst headache of life, nausea/vomiting.  - Doing well on Omeprazole, no issues/concerns.  - Bilateral shoulder numbness. Denies recent trauma/injury and red flag symptoms. 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 - Hair loss for several months.   There are no active problems to display for this patient.    Current Outpatient Medications on File Prior to Visit  Medication Sig Dispense Refill   famotidine (PEPCID) 10 MG tablet Take 10 mg by mouth daily.     pantoprazole (PROTONIX) 20 MG tablet Take 20 mg by mouth daily.     solifenacin (VESICARE) 10 MG tablet Take 10 mg by mouth daily. Samples given in office x 7 days use     spironolactone (ALDACTONE) 25 MG tablet Take 1 tablet (25 mg total) by mouth daily. (Patient not taking: Reported on 05/19/2023) 90 tablet 0   No current facility-administered medications on file prior to visit.    Allergies  Allergen Reactions   Diltiazem Hcl Other (See Comments)    Other Reaction(s): hair loss   Lisinopril-Hydrochlorothiazide Swelling    Other Reaction(s): eye swelling   Hydrochlorothiazide W-Triamterene Rash    10/23/2014 -  10/15/2014 -  09/24/2014 -  08/19/2014 -    Other Reaction(s) from [facility]: rash    Social History   Socioeconomic History   Marital status: Single    Spouse name: Not on file   Number of children: Not on file   Years of education: Not on file   Highest education level: Not on file  Occupational History   Not on file  Tobacco Use   Smoking status: Every Day    Current packs/day: 0.25     Average packs/day: 0.3 packs/day for 32.0 years (8.0 ttl pk-yrs)    Types: Cigarettes   Smokeless tobacco: Never  Vaping Use   Vaping status: Never Used  Substance and Sexual Activity   Alcohol use: No   Drug use: Yes    Types: "Crack" cocaine    Comment: no drug use for 5 years--  04-08-2015  at pre-op,  pt denies any hx of drug use   Sexual activity: Not Currently  Other Topics Concern   Not on file  Social History Narrative   Not on file   Social Drivers of Health   Financial Resource Strain: Low Risk  (05/19/2023)   Overall Financial Resource Strain (CARDIA)    Difficulty of Paying Living Expenses: Not hard at all  Food Insecurity: Not on File (11/17/2022)   Received from [facility] Insecurity    Food: 0  Transportation Needs: Not on File (06/10/2021)   Received from [place] [employer] Needs    Transportation: 0  Physical Activity: Inactive (05/19/2023)   Exercise Vital Sign    Days of Exercise per Week: 0 days    Minutes of Exercise per Session: 0 min  Stress: No Stress Concern Present (05/19/2023)   [name] of Occupational Health - Occupational Stress Questionnaire    Feeling of Stress : Not at all  Social Connections: Moderately Isolated (  05/19/2023)   Social Connection and Isolation Panel [NHANES]    Frequency of Communication with Friends and Family: More than three times a week    Frequency of Social Gatherings with Friends and Family: More than three times a week    Attends Religious Services: Never    Database administrator or Organizations: No    Attends Banker Meetings: Never    Marital Status: Living with partner  Intimate Partner Violence: Not At Risk (05/19/2023)   Humiliation, Afraid, Rape, and Kick questionnaire    Fear of Current or Ex-Partner: No    Emotionally Abused: No    Physically Abused: No    Sexually Abused: No    Family History  Problem Relation Age of Onset   Cancer Mother        lung cancer    Seizures Sister    Breast cancer Sister 74   Breast cancer Sister 43    Past Surgical History:  Procedure Laterality Date   BREAST BIOPSY Right 10/17/2014   Stereo- Benign   CYSTOSCOPY WITH LITHOLAPAXY N/A 04/09/2015   Procedure: CYSTOSCOPY WITH LITHOLAPAXY WITH Lavonna Monarch;  Surgeon: Marcine Matar, MD;  Location: [facility];  Service: Urology;  Laterality: N/A;   SALPINGOOPHORECTOMY  1980's   Unilateral    ROS: Review of Systems Negative except as stated above  PHYSICAL EXAM: BP 136/84   Pulse 73   Temp 98.1 F (36.7 C) (Oral)   Ht 5\' 3"  (1.6 m)   Wt 171 lb 3.2 oz (77.7 kg)   LMP 04/17/2010   SpO2 96%   BMI 30.33 kg/m   Physical Exam HENT:     Head: Normocephalic and atraumatic.     Nose: Nose normal.     Mouth/Throat:     Mouth: Mucous membranes are moist.     Pharynx: Oropharynx is clear.  Eyes:     Extraocular Movements: Extraocular movements intact.     Conjunctiva/sclera: Conjunctivae normal.     Pupils: Pupils are equal, round, and reactive to light.  Cardiovascular:     Rate and Rhythm: Normal rate and regular rhythm.     Pulses: Normal pulses.     Heart sounds: Normal heart sounds.  Pulmonary:     Effort: Pulmonary effort is normal.     Breath sounds: Normal breath sounds.  Musculoskeletal:        General: Normal range of motion.     Right shoulder: Normal.     Left shoulder: Normal.     Right upper arm: Normal.     Left upper arm: Normal.     Right elbow: Normal.     Left elbow: Normal.     Right forearm: Normal.     Left forearm: Normal.     Right wrist: Normal.     Left wrist: Normal.     Right hand: Normal.     Left hand: Normal.     Cervical back: Normal, normal range of motion and neck supple.     Thoracic back: Normal.     Lumbar back: Normal.     Right hip: Normal.     Left hip: Normal.     Right upper leg: Normal.     Left upper leg: Normal.     Right knee: Normal.     Left knee: Normal.     Right lower leg:  Normal.     Left lower leg: Normal.     Right ankle: Normal.  Left ankle: Normal.     Right foot: Normal.     Left foot: Normal.  Neurological:     General: No focal deficit present.     Mental Status: She is alert and oriented to person, place, and time.  Psychiatric:        Mood and Affect: Mood normal.        Behavior: Behavior normal.     ASSESSMENT AND PLAN: 1. Primary hypertension (Primary) - Continue Hydrochlorothiazide and Spironolactone as prescribed.  - Routine screening.  - Counseled on blood pressure goal of less than 130/80, low-sodium, DASH diet, medication compliance, and 150 minutes of moderate intensity exercise per week as tolerated. Counseled on medication adherence and adverse effects. - Follow-up with primary provider in 3 months or sooner if needed.  - hydrochlorothiazide (HYDRODIURIL) 50 MG tablet; Take 1 tablet (50 mg total) by mouth daily.  Dispense: 90 tablet; Refill: 0 - spironolactone (ALDACTONE) 25 MG tablet; Take 1 tablet (25 mg total) by mouth daily.  Dispense: 90 tablet; Refill: 0 - Basic Metabolic Panel  2. Gastroesophageal reflux disease, unspecified whether esophagitis present - Continue Omeprazole as prescribed. Counseled on medication adherence/adverse effects.  - Follow-up with primary provider in 3 months or sooner if needed. - omeprazole (PRILOSEC) 20 MG capsule; Take 1 capsule (20 mg total) by mouth daily.  Dispense: 90 capsule; Refill: 0  3. Bilateral shoulder pain, unspecified chronicity - Trail Gabapentin as prescribed. Counseled on medication adherence/adverse effects.  - Follow-up with primary provider in 4 weeks or sooner if needed.  - gabapentin (NEURONTIN) 300 MG capsule; Take 1 capsule (300 mg total) by mouth at bedtime.  Dispense: 30 capsule; Refill: 1  4. Hair loss - Routine screening.  - Referral to Dermatology for evaluation/management. - Ambulatory referral to Dermatology - CBC - TSH - Vitamin D,  25-hydroxy    Patient was given the opportunity to ask questions.  Patient verbalized understanding of the plan and was able to repeat key elements of the plan. Patient was given clear instructions to go to Emergency Department or return to medical center if symptoms don't improve, worsen, or new problems develop.The patient verbalized understanding.   Orders Placed This Encounter  Procedures   Basic Metabolic Panel   CBC   TSH   Vitamin D, 25-hydroxy   Ambulatory referral to Dermatology     Requested Prescriptions   Signed Prescriptions Disp Refills   hydrochlorothiazide (HYDRODIURIL) 50 MG tablet 90 tablet 0    Sig: Take 1 tablet (50 mg total) by mouth daily.   omeprazole (PRILOSEC) 20 MG capsule 90 capsule 0    Sig: Take 1 capsule (20 mg total) by mouth daily.   spironolactone (ALDACTONE) 25 MG tablet 90 tablet 0    Sig: Take 1 tablet (25 mg total) by mouth daily.   gabapentin (NEURONTIN) 300 MG capsule 30 capsule 1    Sig: Take 1 capsule (300 mg total) by mouth at bedtime.    Return in about 3 months (around 08/19/2023) for Follow-Up or next available chronic conditions.  Rema Fendt, NP

## 2023-05-20 LAB — BASIC METABOLIC PANEL WITH GFR
BUN/Creatinine Ratio: 20 (ref 9–23)
BUN: 16 mg/dL (ref 6–24)
CO2: 19 mmol/L — ABNORMAL LOW (ref 20–29)
Calcium: 9.4 mg/dL (ref 8.7–10.2)
Chloride: 106 mmol/L (ref 96–106)
Creatinine, Ser: 0.8 mg/dL (ref 0.57–1.00)
Glucose: 81 mg/dL (ref 70–99)
Potassium: 3.9 mmol/L (ref 3.5–5.2)
Sodium: 140 mmol/L (ref 134–144)
eGFR: 85 mL/min/{1.73_m2} (ref >59)

## 2023-05-20 LAB — CBC
Hematocrit: 37.7 % (ref 34.0–46.6)
Hemoglobin: 12.3 g/dL (ref 11.1–15.9)
MCH: 28.8 pg (ref 26.6–33.0)
MCHC: 32.6 g/dL (ref 31.5–35.7)
MCV: 88 fL (ref 79–97)
Platelets: 234 10*3/uL (ref 150–450)
RBC: 4.27 x10E6/uL (ref 3.77–5.28)
RDW: 14 % (ref 11.7–15.4)
WBC: 5.2 10*3/uL (ref 3.4–10.8)

## 2023-05-20 LAB — TSH: TSH: 1.54 u[IU]/mL (ref 0.450–4.500)

## 2023-05-20 LAB — VITAMIN D 25 HYDROXY (VIT D DEFICIENCY, FRACTURES): Vit D, 25-Hydroxy: 11.9 ng/mL — ABNORMAL LOW (ref 30.0–100.0)

## 2023-05-22 ENCOUNTER — Encounter: Payer: Self-pay | Admitting: Family

## 2023-05-22 ENCOUNTER — Other Ambulatory Visit: Payer: Self-pay | Admitting: Family

## 2023-05-22 DIAGNOSIS — E559 Vitamin D deficiency, unspecified: Secondary | ICD-10-CM

## 2023-05-22 MED ORDER — VITAMIN D (ERGOCALCIFEROL) 1.25 MG (50000 UNIT) PO CAPS: 50000.0000 [IU] | ORAL_CAPSULE | ORAL | 0 refills | Status: AC

## 2023-06-16 ENCOUNTER — Other Ambulatory Visit: Payer: Self-pay | Admitting: Family

## 2023-06-16 DIAGNOSIS — Z1231 Encounter for screening mammogram for malignant neoplasm of breast: Secondary | ICD-10-CM

## 2023-06-23 ENCOUNTER — Ambulatory Visit
Admission: RE | Admit: 2023-06-23 | Discharge: 2023-06-23 | Disposition: A | Discharge status: Home / Self Care | Source: Ambulatory Visit | Attending: Family Medicine | Admitting: Family Medicine

## 2023-06-23 DIAGNOSIS — Z1231 Encounter for screening mammogram for malignant neoplasm of breast: Secondary | ICD-10-CM

## 2023-06-28 ENCOUNTER — Other Ambulatory Visit: Payer: Self-pay | Admitting: Family

## 2023-06-28 DIAGNOSIS — R928 Other abnormal and inconclusive findings on diagnostic imaging of breast: Secondary | ICD-10-CM

## 2023-07-14 ENCOUNTER — Ambulatory Visit
Admission: RE | Admit: 2023-07-14 | Discharge: 2023-07-14 | Disposition: A | Discharge status: Home / Self Care | Source: Ambulatory Visit | Attending: Family | Admitting: Family

## 2023-07-14 DIAGNOSIS — R928 Other abnormal and inconclusive findings on diagnostic imaging of breast: Secondary | ICD-10-CM

## 2023-07-18 ENCOUNTER — Ambulatory Visit: Payer: Self-pay | Admitting: Family

## 2023-08-08 ENCOUNTER — Other Ambulatory Visit: Payer: Self-pay | Admitting: Family

## 2023-08-08 DIAGNOSIS — M25511 Pain in right shoulder: Secondary | ICD-10-CM

## 2023-08-08 NOTE — Telephone Encounter (Signed)
 Complete

## 2023-08-12 ENCOUNTER — Other Ambulatory Visit: Payer: Self-pay | Admitting: Family

## 2023-08-12 DIAGNOSIS — E559 Vitamin D deficiency, unspecified: Secondary | ICD-10-CM

## 2023-08-14 ENCOUNTER — Other Ambulatory Visit: Payer: Self-pay | Admitting: Family

## 2023-08-14 DIAGNOSIS — E559 Vitamin D deficiency, unspecified: Secondary | ICD-10-CM

## 2023-08-14 NOTE — Telephone Encounter (Signed)
 Schedule lab only appointment to recheck Vitamin D .

## 2023-08-15 NOTE — Telephone Encounter (Signed)
 Pt decided to get her labs done at her next scheduled visit on Friday 06/27

## 2023-08-17 ENCOUNTER — Other Ambulatory Visit: Payer: Self-pay | Admitting: Family

## 2023-08-17 DIAGNOSIS — I10 Essential (primary) hypertension: Secondary | ICD-10-CM

## 2023-08-18 ENCOUNTER — Ambulatory Visit (INDEPENDENT_AMBULATORY_CARE_PROVIDER_SITE_OTHER): Admitting: Family

## 2023-08-18 ENCOUNTER — Encounter: Payer: Self-pay | Admitting: Family

## 2023-08-18 VITALS — BP 139/88 | HR 71 | Temp 98.3°F | Resp 16 | Ht 63.0 in | Wt 165.8 lb

## 2023-08-18 DIAGNOSIS — M25511 Pain in right shoulder: Secondary | ICD-10-CM

## 2023-08-18 DIAGNOSIS — Z23 Encounter for immunization: Secondary | ICD-10-CM

## 2023-08-18 DIAGNOSIS — M25512 Pain in left shoulder: Secondary | ICD-10-CM

## 2023-08-18 DIAGNOSIS — I10 Essential (primary) hypertension: Secondary | ICD-10-CM | POA: Diagnosis not present

## 2023-08-18 DIAGNOSIS — K219 Gastro-esophageal reflux disease without esophagitis: Secondary | ICD-10-CM

## 2023-08-18 DIAGNOSIS — E559 Vitamin D deficiency, unspecified: Secondary | ICD-10-CM

## 2023-08-18 DIAGNOSIS — M62838 Other muscle spasm: Secondary | ICD-10-CM

## 2023-08-18 MED ORDER — HYDROCHLOROTHIAZIDE 50 MG PO TABS: 50.0000 mg | ORAL_TABLET | Freq: Every day | ORAL | 0 refills | Status: DC

## 2023-08-18 MED ORDER — GABAPENTIN 300 MG PO CAPS: 300.0000 mg | ORAL_CAPSULE | Freq: Every day | ORAL | 0 refills | Status: DC

## 2023-08-18 MED ORDER — OMEPRAZOLE 20 MG PO CPDR
20.0000 mg | DELAYED_RELEASE_CAPSULE | Freq: Every day | ORAL | 0 refills | Status: DC
Start: 2023-08-18 — End: 2023-11-17

## 2023-08-18 MED ORDER — CYCLOBENZAPRINE HCL 5 MG PO TABS: 5.0000 mg | ORAL_TABLET | Freq: Every day | ORAL | 0 refills | Status: DC | PRN

## 2023-08-18 MED ORDER — SPIRONOLACTONE 25 MG PO TABS: 25.0000 mg | ORAL_TABLET | Freq: Every day | ORAL | 0 refills | Status: DC

## 2023-08-18 NOTE — Progress Notes (Signed)
 Patient ID: Melanie Reed, female    DOB: 10-17-1965  MRN: [id number]  CC: Chronic Conditions Follow-Up  Subjective: Melanie Reed is a 58 y.o. female who presents for chronic conditions follow-up.   Her concerns today include:  - Doing well on Hydrochlorothiazide  and Spironolactone , no issues/concerns. States Dermatology is considering prescribing her a medication that may help with hair growth but could possibly cause adverse effects if taken with blood pressure medication. I discussed with patient in detail recommendation for Dermatology to consider prescribing an alternative medication to assist with hair growth due to patient's blood pressure is at goal on current regimen.  - Doing well on Omeprazole , no issues/concerns.  - Left shoulder pain persisting. Reports recently she noticed intermittent left shoulder muscle spasms. She denies recent trauma/injury and red flag symptoms.  - Vitamin D  lab.   There are no active problems to display for this patient.    Current Outpatient Medications on File Prior to Visit  Medication Sig Dispense Refill   doxycycline (ADOXA) 100 MG tablet Take 100 mg by mouth 2 (two) times daily.     famotidine (PEPCID) 10 MG tablet Take 10 mg by mouth daily.     pantoprazole (PROTONIX) 20 MG tablet Take 20 mg by mouth daily.     solifenacin (VESICARE) 10 MG tablet Take 10 mg by mouth daily. Samples given in office x 7 days use     spironolactone  (ALDACTONE ) 25 MG tablet Take 1 tablet (25 mg total) by mouth daily. (Patient not taking: Reported on 05/19/2023) 90 tablet 0   No current facility-administered medications on file prior to visit.    Allergies  Allergen Reactions   Diltiazem Hcl Other (See Comments)    Other Reaction(s): hair loss   Lisinopril-Hydrochlorothiazide  Swelling    Other Reaction(s): eye swelling   Hydrochlorothiazide -Triamterene Rash    10/23/2014 -  10/15/2014 -  09/24/2014 -  08/19/2014 -    Other Reaction(s) from [facility]:  rash    Social History   Socioeconomic History   Marital status: Single    Spouse name: Not on file   Number of children: Not on file   Years of education: Not on file   Highest education level: Not on file  Occupational History   Not on file  Tobacco Use   Smoking status: Every Day    Current packs/day: 0.25    Average packs/day: 0.3 packs/day for 32.0 years (8.0 ttl pk-yrs)    Types: Cigarettes   Smokeless tobacco: Never  Vaping Use   Vaping status: Never Used  Substance and Sexual Activity   Alcohol use: No   Drug use: Yes    Types: Crack cocaine    Comment: no drug use for 5 years--  04-08-2015  at pre-op,  pt denies any hx of drug use   Sexual activity: Not Currently  Other Topics Concern   Not on file  Social History Narrative   Not on file   Social Drivers of Health   Financial Resource Strain: Low Risk  (05/19/2023)   Overall Financial Resource Strain (CARDIA)    Difficulty of Paying Living Expenses: Not hard at all  Food Insecurity: No Food Insecurity (08/18/2023)   Hunger Vital Sign    Worried About Running Out of Food in the Last Year: Never true    Ran Out of Food in the Last Year: Never true  Transportation Needs: No Transportation Needs (08/18/2023)   PRAPARE - Transportation    Lack of  Transportation (Medical): No    Lack of Transportation (Non-Medical): No  Physical Activity: Inactive (05/19/2023)   Exercise Vital Sign    Days of Exercise per Week: 0 days    Minutes of Exercise per Session: 0 min  Stress: No Stress Concern Present (05/19/2023)   [name] of Occupational Health - Occupational Stress Questionnaire    Feeling of Stress : Not at all  Social Connections: Moderately Isolated (05/19/2023)   Social Connection and Isolation Panel    Frequency of Communication with Friends and Family: More than three times a week    Frequency of Social Gatherings with Friends and Family: More than three times a week    Attends Religious Services:  Never    Database administrator or Organizations: No    Attends Banker Meetings: Never    Marital Status: Living with partner  Intimate Partner Violence: Not At Risk (05/19/2023)   Humiliation, Afraid, Rape, and Kick questionnaire    Fear of Current or Ex-Partner: No    Emotionally Abused: No    Physically Abused: No    Sexually Abused: No    Family History  Problem Relation Age of Onset   Breast cancer Mother    Cancer Mother        lung cancer   Seizures Sister    Breast cancer Sister 21    Past Surgical History:  Procedure Laterality Date   BREAST BIOPSY Right 10/17/2014   Stereo- Benign   CYSTOSCOPY WITH LITHOLAPAXY N/A 04/09/2015   Procedure: CYSTOSCOPY WITH LITHOLAPAXY WITH ERICK;  Surgeon: Garnette Shack, MD;  Location: [facility];  Service: Urology;  Laterality: N/A;   SALPINGOOPHORECTOMY  1980's   Unilateral    ROS: Review of Systems Negative except as stated above  PHYSICAL EXAM: BP 139/88   Pulse 71   Temp 98.3 F (36.8 C) (Oral)   Resp 16   Ht 5' 3 (1.6 m)   Wt 165 lb 12.8 oz (75.2 kg)   LMP 04/17/2010   SpO2 95%   BMI 29.37 kg/m   Physical Exam HENT:     Head: Normocephalic and atraumatic.     Nose: Nose normal.     Mouth/Throat:     Mouth: Mucous membranes are moist.     Pharynx: Oropharynx is clear.   Eyes:     Extraocular Movements: Extraocular movements intact.     Conjunctiva/sclera: Conjunctivae normal.     Pupils: Pupils are equal, round, and reactive to light.    Cardiovascular:     Rate and Rhythm: Normal rate and regular rhythm.     Pulses: Normal pulses.     Heart sounds: Normal heart sounds.  Pulmonary:     Effort: Pulmonary effort is normal.     Breath sounds: Normal breath sounds.   Musculoskeletal:        General: Normal range of motion.     Right shoulder: Normal.     Left shoulder: Normal.     Right upper arm: Normal.     Left upper arm: Normal.     Right elbow: Normal.      Left elbow: Normal.     Right forearm: Normal.     Left forearm: Normal.     Right wrist: Normal.     Left wrist: Normal.     Right hand: Normal.     Left hand: Normal.     Cervical back: Normal range of motion and neck supple.   Neurological:  General: No focal deficit present.     Mental Status: She is alert and oriented to person, place, and time.   Psychiatric:        Mood and Affect: Mood normal.        Behavior: Behavior normal.   ASSESSMENT AND PLAN: 1. Primary hypertension (Primary) - Continue Hydrochlorothiazide  and Spironolactone  as prescribed.  - Counseled on blood pressure goal of less than 130/80, low-sodium, DASH diet, medication compliance, and 150 minutes of moderate intensity exercise per week as tolerated. Counseled on medication adherence and adverse effects. - Follow-up with primary provider in 3 months or sooner if needed.  - spironolactone  (ALDACTONE ) 25 MG tablet; Take 1 tablet (25 mg total) by mouth daily.  Dispense: 90 tablet; Refill: 0 - hydrochlorothiazide  (HYDRODIURIL ) 50 MG tablet; Take 1 tablet (50 mg total) by mouth daily.  Dispense: 90 tablet; Refill: 0  2. Gastroesophageal reflux disease, unspecified whether esophagitis present - Continue Omeprazole  as prescribed. Counseled on medication adherence/adverse effects. - Follow-up with primary provider in 3 months or sooner if needed.  - omeprazole  (PRILOSEC) 20 MG capsule; Take 1 capsule (20 mg total) by mouth daily.  Dispense: 90 capsule; Refill: 0  3. Bilateral shoulder pain, unspecified chronicity 4. Muscle spasm - Continue Gabapentin  as prescribed. Counseled on medication adherence/adverse effects.  - Trial Cyclobenzaprine as prescribed. Counseled on medication adherence/adverse effects. - Follow-up with primary provider as scheduled. - gabapentin  (NEURONTIN ) 300 MG capsule; Take 1 capsule (300 mg total) by mouth at bedtime.  Dispense: 90 capsule; Refill: 0 - cyclobenzaprine (FLEXERIL) 5 MG  tablet; Take 1 tablet (5 mg total) by mouth daily as needed for muscle spasms.  Dispense: 90 tablet; Refill: 0  5. Vitamin D  deficiency - Routine screening.  - Vitamin D , 25-hydroxy  6. Immunization due 7. Need for hepatitis vaccination - Administered. - Tdap vaccine greater than or equal to 7yo IM   Patient was given the opportunity to ask questions.  Patient verbalized understanding of the plan and was able to repeat key elements of the plan. Patient was given clear instructions to go to Emergency Department or return to medical center if symptoms don't improve, worsen, or new problems develop.The patient verbalized understanding.   Orders Placed This Encounter  Procedures   Tdap vaccine greater than or equal to 7yo IM   Vitamin D , 25-hydroxy     Requested Prescriptions   Signed Prescriptions Disp Refills   spironolactone  (ALDACTONE ) 25 MG tablet 90 tablet 0    Sig: Take 1 tablet (25 mg total) by mouth daily.   hydrochlorothiazide  (HYDRODIURIL ) 50 MG tablet 90 tablet 0    Sig: Take 1 tablet (50 mg total) by mouth daily.   omeprazole  (PRILOSEC) 20 MG capsule 90 capsule 0    Sig: Take 1 capsule (20 mg total) by mouth daily.   gabapentin  (NEURONTIN ) 300 MG capsule 90 capsule 0    Sig: Take 1 capsule (300 mg total) by mouth at bedtime.   cyclobenzaprine (FLEXERIL) 5 MG tablet 90 tablet 0    Sig: Take 1 tablet (5 mg total) by mouth daily as needed for muscle spasms.    Return in about 3 months (around 11/18/2023) for Follow-Up or next available chronic conditions.  Melanie JINNY Drones, NP

## 2023-08-18 NOTE — Progress Notes (Signed)
 3 month follow up, vitamin d  level check.  Patient needs to check on medication the dermatologist is interest in giving her however it may interfere with her b/p medication

## 2023-08-19 LAB — VITAMIN D 25 HYDROXY (VIT D DEFICIENCY, FRACTURES): Vit D, 25-Hydroxy: 50.5 ng/mL (ref 30.0–100.0)

## 2023-08-21 ENCOUNTER — Ambulatory Visit: Payer: Self-pay | Admitting: Family

## 2023-11-17 ENCOUNTER — Encounter: Payer: Self-pay | Admitting: Family

## 2023-11-17 ENCOUNTER — Ambulatory Visit (INDEPENDENT_AMBULATORY_CARE_PROVIDER_SITE_OTHER): Admitting: Family

## 2023-11-17 VITALS — BP 120/83 | HR 79 | Temp 98.4°F | Resp 16 | Ht 63.0 in | Wt 160.4 lb

## 2023-11-17 DIAGNOSIS — K219 Gastro-esophageal reflux disease without esophagitis: Secondary | ICD-10-CM | POA: Diagnosis not present

## 2023-11-17 DIAGNOSIS — I10 Essential (primary) hypertension: Secondary | ICD-10-CM

## 2023-11-17 DIAGNOSIS — M25511 Pain in right shoulder: Secondary | ICD-10-CM

## 2023-11-17 DIAGNOSIS — M62838 Other muscle spasm: Secondary | ICD-10-CM

## 2023-11-17 DIAGNOSIS — M25512 Pain in left shoulder: Secondary | ICD-10-CM

## 2023-11-17 MED ORDER — HYDROCHLOROTHIAZIDE 50 MG PO TABS: 50.0000 mg | ORAL_TABLET | Freq: Every day | ORAL | 0 refills | Status: DC

## 2023-11-17 MED ORDER — CYCLOBENZAPRINE HCL 5 MG PO TABS: 5.0000 mg | ORAL_TABLET | Freq: Every day | ORAL | 0 refills | Status: DC | PRN

## 2023-11-17 MED ORDER — SPIRONOLACTONE 25 MG PO TABS: 25.0000 mg | ORAL_TABLET | Freq: Every day | ORAL | 0 refills | Status: AC

## 2023-11-17 MED ORDER — OMEPRAZOLE 20 MG PO CPDR: 20.0000 mg | DELAYED_RELEASE_CAPSULE | Freq: Every day | ORAL | 0 refills | Status: DC

## 2023-11-17 MED ORDER — GABAPENTIN 300 MG PO CAPS: 300.0000 mg | ORAL_CAPSULE | Freq: Every day | ORAL | 0 refills | Status: DC

## 2023-11-17 NOTE — Progress Notes (Signed)
 3 month follow-up

## 2023-11-17 NOTE — Progress Notes (Signed)
 Patient ID: Melanie Reed, female    DOB: 16-Mar-1965  MRN: [id number]  CC: Chronic Conditions Follow-Up  Subjective: Melanie Reed is a 58 y.o. female who presents for chronic conditions follow-up.   Her concerns today include:  - Doing well on Hydrochlorothiazide  and Spironolactone , no issues/concerns. She does not complain of red flag symptoms such as but not limited to chest pain, shortness of breath, worst headache of life, nausea/vomiting.  - Doing well on Omeprazole , no issues/concerns.  - Doing well on Gabapentin  and Cyclobenzaprine , no issues/concerns.  - Patient prefers to wait for lab at next office visit.   There are no active problems to display for this patient.    Current Outpatient Medications on File Prior to Visit  Medication Sig Dispense Refill   doxycycline (ADOXA) 100 MG tablet Take 100 mg by mouth 2 (two) times daily.     famotidine (PEPCID) 10 MG tablet Take 10 mg by mouth daily.     pantoprazole (PROTONIX) 20 MG tablet Take 20 mg by mouth daily.     solifenacin (VESICARE) 10 MG tablet Take 10 mg by mouth daily. Samples given in office x 7 days use     spironolactone  (ALDACTONE ) 25 MG tablet Take 1 tablet (25 mg total) by mouth daily. (Patient not taking: Reported on 05/19/2023) 90 tablet 0   No current facility-administered medications on file prior to visit.    Allergies  Allergen Reactions   Diltiazem Hcl Other (See Comments)    Other Reaction(s): hair loss   Lisinopril-Hydrochlorothiazide  Swelling    Other Reaction(s): eye swelling   Hydrochlorothiazide -Triamterene Rash    10/23/2014 -  10/15/2014 -  09/24/2014 -  08/19/2014 -    Other Reaction(s) from [facility]: rash    Social History   Socioeconomic History   Marital status: Single    Spouse name: Not on file   Number of children: Not on file   Years of education: Not on file   Highest education level: Not on file  Occupational History   Not on file  Tobacco Use   Smoking status:  Every Day    Current packs/day: 0.25    Average packs/day: 0.3 packs/day for 32.0 years (8.0 ttl pk-yrs)    Types: Cigarettes   Smokeless tobacco: Never  Vaping Use   Vaping status: Never Used  Substance and Sexual Activity   Alcohol use: No   Drug use: Yes    Types: Crack cocaine    Comment: no drug use for 5 years--  04-08-2015  at pre-op,  pt denies any hx of drug use   Sexual activity: Not Currently  Other Topics Concern   Not on file  Social History Narrative   Not on file   Social Drivers of Health   Financial Resource Strain: Low Risk  (05/19/2023)   Overall Financial Resource Strain (CARDIA)    Difficulty of Paying Living Expenses: Not hard at all  Food Insecurity: No Food Insecurity (08/18/2023)   Hunger Vital Sign    Worried About Running Out of Food in the Last Year: Never true    Ran Out of Food in the Last Year: Never true  Transportation Needs: No Transportation Needs (08/18/2023)   PRAPARE - Administrator, Civil Service (Medical): No    Lack of Transportation (Non-Medical): No  Physical Activity: Inactive (05/19/2023)   Exercise Vital Sign    Days of Exercise per Week: 0 days    Minutes of Exercise per Session:  0 min  Stress: No Stress Concern Present (05/19/2023)   [name] of Occupational Health - Occupational Stress Questionnaire    Feeling of Stress : Not at all  Social Connections: Moderately Isolated (05/19/2023)   Social Connection and Isolation Panel    Frequency of Communication with Friends and Family: More than three times a week    Frequency of Social Gatherings with Friends and Family: More than three times a week    Attends Religious Services: Never    Database administrator or Organizations: No    Attends Banker Meetings: Never    Marital Status: Living with partner  Intimate Partner Violence: Not At Risk (05/19/2023)   Humiliation, Afraid, Rape, and Kick questionnaire    Fear of Current or Ex-Partner: No     Emotionally Abused: No    Physically Abused: No    Sexually Abused: No    Family History  Problem Relation Age of Onset   Breast cancer Mother    Cancer Mother        lung cancer   Seizures Sister    Breast cancer Sister 55    Past Surgical History:  Procedure Laterality Date   BREAST BIOPSY Right 10/17/2014   Stereo- Benign   CYSTOSCOPY WITH LITHOLAPAXY N/A 04/09/2015   Procedure: CYSTOSCOPY WITH LITHOLAPAXY WITH ERICK;  Surgeon: Garnette Shack, MD;  Location: [facility];  Service: Urology;  Laterality: N/A;   SALPINGOOPHORECTOMY  1980's   Unilateral    ROS: Review of Systems Negative except as stated above  PHYSICAL EXAM: BP 120/83   Pulse 79   Temp 98.4 F (36.9 C) (Oral)   Resp 16   Ht 5' 3 (1.6 m)   Wt 160 lb 6.4 oz (72.8 kg)   LMP 04/17/2010   SpO2 97%   BMI 28.41 kg/m   Physical Exam HENT:     Head: Normocephalic and atraumatic.     Nose: Nose normal.     Mouth/Throat:     Mouth: Mucous membranes are moist.     Pharynx: Oropharynx is clear.  Eyes:     Extraocular Movements: Extraocular movements intact.     Conjunctiva/sclera: Conjunctivae normal.     Pupils: Pupils are equal, round, and reactive to light.  Cardiovascular:     Rate and Rhythm: Normal rate and regular rhythm.     Pulses: Normal pulses.     Heart sounds: Normal heart sounds.  Pulmonary:     Effort: Pulmonary effort is normal.     Breath sounds: Normal breath sounds.  Musculoskeletal:        General: Normal range of motion.     Cervical back: Normal range of motion and neck supple.  Neurological:     General: No focal deficit present.     Mental Status: She is alert and oriented to person, place, and time.  Psychiatric:        Mood and Affect: Mood normal.        Behavior: Behavior normal.     ASSESSMENT AND PLAN: 1. Primary hypertension (Primary) - Continue Hydrochlorothiazide  and Spironolactone  as prescribed.  - Counseled on blood pressure goal  of less than 130/80, low-sodium, DASH diet, medication compliance, and 150 minutes of moderate intensity exercise per week as tolerated. Counseled on medication adherence and adverse effects. - Follow-up with primary provider in 3 months or sooner if needed.  - hydrochlorothiazide  (HYDRODIURIL ) 50 MG tablet; Take 1 tablet (50 mg total) by mouth daily.  Dispense: 90 tablet; Refill: 0 - spironolactone  (ALDACTONE ) 25 MG tablet; Take 1 tablet (25 mg total) by mouth daily.  Dispense: 90 tablet; Refill: 0  2. Gastroesophageal reflux disease, unspecified whether esophagitis present - Continue Omeprazole  as prescribed. Counseled on medication adherence/adverse effects. - Follow-up with primary provider in 3 months or sooner if needed.  - omeprazole  (PRILOSEC) 20 MG capsule; Take 1 capsule (20 mg total) by mouth daily.  Dispense: 90 capsule; Refill: 0  3. Bilateral shoulder pain, unspecified chronicity 4. Muscle spasm - Continue Gabapentin  and Cyclobenzaprine  as prescribed. Counseled on medication adherence/adverse effects.  - Follow-up with primary provider in 3 months or sooner if needed. - gabapentin  (NEURONTIN ) 300 MG capsule; Take 1 capsule (300 mg total) by mouth at bedtime.  Dispense: 90 capsule; Refill: 0 - cyclobenzaprine  (FLEXERIL ) 5 MG tablet; Take 1 tablet (5 mg total) by mouth daily as needed for muscle spasms.  Dispense: 90 tablet; Refill: 0  Patient was given the opportunity to ask questions.  Patient verbalized understanding of the plan and was able to repeat key elements of the plan. Patient was given clear instructions to go to Emergency Department or return to medical center if symptoms don't improve, worsen, or new problems develop.The patient verbalized understanding.   Requested Prescriptions   Signed Prescriptions Disp Refills   hydrochlorothiazide  (HYDRODIURIL ) 50 MG tablet 90 tablet 0    Sig: Take 1 tablet (50 mg total) by mouth daily.   spironolactone  (ALDACTONE ) 25 MG  tablet 90 tablet 0    Sig: Take 1 tablet (25 mg total) by mouth daily.   omeprazole  (PRILOSEC) 20 MG capsule 90 capsule 0    Sig: Take 1 capsule (20 mg total) by mouth daily.   gabapentin  (NEURONTIN ) 300 MG capsule 90 capsule 0    Sig: Take 1 capsule (300 mg total) by mouth at bedtime.   cyclobenzaprine  (FLEXERIL ) 5 MG tablet 90 tablet 0    Sig: Take 1 tablet (5 mg total) by mouth daily as needed for muscle spasms.    Return in about 3 months (around 02/16/2024) for Follow-Up or next available chronic conditions.  Greig JINNY Drones, NP

## 2024-02-19 ENCOUNTER — Ambulatory Visit (INDEPENDENT_AMBULATORY_CARE_PROVIDER_SITE_OTHER): Admitting: Family

## 2024-02-19 ENCOUNTER — Encounter: Payer: Self-pay | Admitting: Family

## 2024-02-19 VITALS — BP 121/82 | HR 84 | Temp 98.5°F | Resp 16 | Ht 63.0 in | Wt 172.4 lb

## 2024-02-19 DIAGNOSIS — K219 Gastro-esophageal reflux disease without esophagitis: Secondary | ICD-10-CM | POA: Diagnosis not present

## 2024-02-19 DIAGNOSIS — M62838 Other muscle spasm: Secondary | ICD-10-CM | POA: Diagnosis not present

## 2024-02-19 DIAGNOSIS — M25511 Pain in right shoulder: Secondary | ICD-10-CM | POA: Diagnosis not present

## 2024-02-19 DIAGNOSIS — M25512 Pain in left shoulder: Secondary | ICD-10-CM | POA: Diagnosis not present

## 2024-02-19 DIAGNOSIS — I10 Essential (primary) hypertension: Secondary | ICD-10-CM | POA: Diagnosis not present

## 2024-02-19 DIAGNOSIS — Z87898 Personal history of other specified conditions: Secondary | ICD-10-CM

## 2024-02-19 MED ORDER — HYDROCHLOROTHIAZIDE 50 MG PO TABS: 50.0000 mg | ORAL_TABLET | Freq: Every day | ORAL | 0 refills | Status: AC

## 2024-02-19 MED ORDER — SPIRONOLACTONE 25 MG PO TABS: 25.0000 mg | ORAL_TABLET | Freq: Every day | ORAL | 0 refills | Status: AC

## 2024-02-19 MED ORDER — GABAPENTIN 300 MG PO CAPS: 300.0000 mg | ORAL_CAPSULE | Freq: Every day | ORAL | 0 refills | Status: AC

## 2024-02-19 MED ORDER — CYCLOBENZAPRINE HCL 5 MG PO TABS: 5.0000 mg | ORAL_TABLET | Freq: Every day | ORAL | 0 refills | Status: AC | PRN

## 2024-02-19 MED ORDER — OMEPRAZOLE 20 MG PO CPDR: 20.0000 mg | DELAYED_RELEASE_CAPSULE | Freq: Every day | ORAL | 0 refills | Status: AC

## 2024-02-19 NOTE — Progress Notes (Signed)
 "   Patient ID: Melanie Reed, female    DOB: [birth year]  MRN: [id number]  CC: Chronic Conditions Follow-Up  Subjective: Clifton Kovacic is a 58 y.o. female who presents for chronic conditions follow-up.   Her concerns today include:  - Doing well on Hydrochlorothiazide  and Spironolactone , no issues/concerns. She does not complain of red flag symptoms such as but not limited to chest pain, shortness of breath, worst headache of life, nausea/vomiting.  - Doing well on Omeprazole , no issues/concerns.  - Doing well on Gabapentin  and Cyclobenzaprine , no issues/concerns.  - History of prediabetes.   There are no active problems to display for this patient.    Medications Ordered Prior to Encounter[1]  Allergies[2]  Social History   Socioeconomic History   Marital status: Single    Spouse name: Not on file   Number of children: Not on file   Years of education: Not on file   Highest education level: Not on file  Occupational History   Not on file  Tobacco Use   Smoking status: Every Day    Current packs/day: 0.25    Average packs/day: 0.3 packs/day for 32.0 years (8.0 ttl pk-yrs)    Types: Cigarettes   Smokeless tobacco: Never  Vaping Use   Vaping status: Never Used  Substance and Sexual Activity   Alcohol use: No   Drug use: Yes    Types: Crack cocaine    Comment: no drug use for 5 years--  04-08-2015  at pre-op,  pt denies any hx of drug use   Sexual activity: Not Currently  Other Topics Concern   Not on file  Social History Narrative   Not on file   Social Drivers of Health   Tobacco Use: High Risk (02/19/2024)   Patient History    Smoking Tobacco Use: Every Day    Smokeless Tobacco Use: Never    Passive Exposure: Not on file  Financial Resource Strain: Low Risk (05/19/2023)   Overall Financial Resource Strain (CARDIA)    Difficulty of Paying Living Expenses: Not hard at all  Food Insecurity: No Food Insecurity (08/18/2023)   Epic    Worried About Radiation Protection Practitioner  of Food in the Last Year: Never true    Ran Out of Food in the Last Year: Never true  Transportation Needs: No Transportation Needs (08/18/2023)   Epic    Lack of Transportation (Medical): No    Lack of Transportation (Non-Medical): No  Physical Activity: Inactive (05/19/2023)   Exercise Vital Sign    Days of Exercise per Week: 0 days    Minutes of Exercise per Session: 0 min  Stress: No Stress Concern Present (05/19/2023)   [name] of Occupational Health - Occupational Stress Questionnaire    Feeling of Stress : Not at all  Social Connections: Moderately Isolated (05/19/2023)   Social Connection and Isolation Panel    Frequency of Communication with Friends and Family: More than three times a week    Frequency of Social Gatherings with Friends and Family: More than three times a week    Attends Religious Services: Never    Database Administrator or Organizations: No    Attends Banker Meetings: Never    Marital Status: Living with partner  Intimate Partner Violence: Not At Risk (05/19/2023)   Humiliation, Afraid, Rape, and Kick questionnaire    Fear of Current or Ex-Partner: No    Emotionally Abused: No    Physically Abused: No    Sexually Abused:  No  Depression (PHQ2-9): Low Risk (02/19/2024)   Depression (PHQ2-9)    PHQ-2 Score: 0  Alcohol Screen: Low Risk (05/19/2023)   Alcohol Screen    Last Alcohol Screening Score (AUDIT): 0  Housing: Unknown (11/17/2023)   Epic    Unable to Pay for Housing in the Last Year: No    Number of Times Moved in the Last Year: Not on file    Homeless in the Last Year: No  Utilities: Not At Risk (08/18/2023)   Epic    Threatened with loss of utilities: No  Health Literacy: Adequate Health Literacy (05/19/2023)   B1300 Health Literacy    Frequency of need for help with medical instructions: Never    Family History  Problem Relation Age of Onset   Breast cancer Mother    Cancer Mother        lung cancer   Seizures Sister     Breast cancer Sister 54    Past Surgical History:  Procedure Laterality Date   BREAST BIOPSY Right 10/17/2014   Stereo- Benign   CYSTOSCOPY WITH LITHOLAPAXY N/A 04/09/2015   Procedure: CYSTOSCOPY WITH LITHOLAPAXY WITH ERICK;  Surgeon: Garnette Shack, MD;  Location: [facility];  Service: Urology;  Laterality: N/A;   SALPINGOOPHORECTOMY  1980's   Unilateral    ROS: Review of Systems Negative except as stated above  PHYSICAL EXAM: BP 121/82 (BP Location: Right Arm, Patient Position: Sitting)   Pulse 84   Temp 98.5 F (36.9 C) (Oral)   Resp 16   Ht 5' 3 (1.6 m)   Wt 172 lb 6.4 oz (78.2 kg)   LMP 04/17/2010   SpO2 95%   BMI 30.54 kg/m   Physical Exam HENT:     Head: Normocephalic and atraumatic.     Nose: Nose normal.     Mouth/Throat:     Mouth: Mucous membranes are moist.     Pharynx: Oropharynx is clear.  Eyes:     Extraocular Movements: Extraocular movements intact.     Conjunctiva/sclera: Conjunctivae normal.     Pupils: Pupils are equal, round, and reactive to light.  Cardiovascular:     Rate and Rhythm: Normal rate and regular rhythm.     Pulses: Normal pulses.     Heart sounds: Normal heart sounds.  Pulmonary:     Effort: Pulmonary effort is normal.     Breath sounds: Normal breath sounds.  Musculoskeletal:        General: Normal range of motion.     Cervical back: Normal range of motion and neck supple.  Neurological:     General: No focal deficit present.     Mental Status: She is alert and oriented to person, place, and time.  Psychiatric:        Mood and Affect: Mood normal.        Behavior: Behavior normal.     ASSESSMENT AND PLAN: 1. Primary hypertension (Primary) - Continue Spironolactone  and Hydrochlorothiazide  as prescribed. - Routine screening.  - Counseled on blood pressure goal of less than 130/80, low-sodium, DASH diet, medication compliance, and 150 minutes of moderate intensity exercise per week as  tolerated. Counseled on medication adherence and adverse effects. - Follow-up with primary provider in 3 months or sooner if needed.  - spironolactone  (ALDACTONE ) 25 MG tablet; Take 1 tablet (25 mg total) by mouth daily.  Dispense: 90 tablet; Refill: 0 - hydrochlorothiazide  (HYDRODIURIL ) 50 MG tablet; Take 1 tablet (50 mg total) by mouth daily.  Dispense:  90 tablet; Refill: 0 - Basic Metabolic Panel  2. Gastroesophageal reflux disease, unspecified whether esophagitis present - Continue Omeprazole  as prescribed. Counseled on medication adherence/adverse effects.  - Follow-up with primary provider in 3 months or sooner if needed.  - omeprazole  (PRILOSEC) 20 MG capsule; Take 1 capsule (20 mg total) by mouth daily.  Dispense: 90 capsule; Refill: 0  3. Bilateral shoulder pain, unspecified chronicity 4. Muscle spasm - Continue Cyclobenzaprine  and Gabapentin  as prescribed. Counseled on medication adherence/adverse effects.  - Follow-up with primary provider in 3 months or sooner if needed. - cyclobenzaprine  (FLEXERIL ) 5 MG tablet; Take 1 tablet (5 mg total) by mouth daily as needed for muscle spasms.  Dispense: 90 tablet; Refill: 0 - gabapentin  (NEURONTIN ) 300 MG capsule; Take 1 capsule (300 mg total) by mouth at bedtime.  Dispense: 90 capsule; Refill: 0  5. History of prediabetes - Routine screening.  - Hemoglobin A1c   Patient was given the opportunity to ask questions.  Patient verbalized understanding of the plan and was able to repeat key elements of the plan. Patient was given clear instructions to go to Emergency Department or return to medical center if symptoms don't improve, worsen, or new problems develop.The patient verbalized understanding.   Orders Placed This Encounter  Procedures   Basic Metabolic Panel   Hemoglobin A1c     Requested Prescriptions   Signed Prescriptions Disp Refills   spironolactone  (ALDACTONE ) 25 MG tablet 90 tablet 0    Sig: Take 1 tablet (25 mg total)  by mouth daily.   hydrochlorothiazide  (HYDRODIURIL ) 50 MG tablet 90 tablet 0    Sig: Take 1 tablet (50 mg total) by mouth daily.   omeprazole  (PRILOSEC) 20 MG capsule 90 capsule 0    Sig: Take 1 capsule (20 mg total) by mouth daily.   cyclobenzaprine  (FLEXERIL ) 5 MG tablet 90 tablet 0    Sig: Take 1 tablet (5 mg total) by mouth daily as needed for muscle spasms.   gabapentin  (NEURONTIN ) 300 MG capsule 90 capsule 0    Sig: Take 1 capsule (300 mg total) by mouth at bedtime.    Return in about 3 months (around 05/19/2024) for Follow-Up or next available chronic conditions.  Greig JINNY Chute, NP      [1]  Current Outpatient Medications on File Prior to Visit  Medication Sig Dispense Refill   famotidine (PEPCID) 10 MG tablet Take 10 mg by mouth daily.     doxycycline (ADOXA) 100 MG tablet Take 100 mg by mouth 2 (two) times daily.     pantoprazole (PROTONIX) 20 MG tablet Take 20 mg by mouth daily.     solifenacin (VESICARE) 10 MG tablet Take 10 mg by mouth daily. Samples given in office x 7 days use     spironolactone  (ALDACTONE ) 25 MG tablet Take 1 tablet (25 mg total) by mouth daily. 90 tablet 0   No current facility-administered medications on file prior to visit.  [2]  Allergies Allergen Reactions   Diltiazem Hcl Other (See Comments)    Other Reaction(s): hair loss   Lisinopril-Hydrochlorothiazide  Swelling    Other Reaction(s): eye swelling   Hydrochlorothiazide -Triamterene Rash    10/23/2014 -  10/15/2014 -  09/24/2014 -  08/19/2014 -    Other Reaction(s) from [facility]: rash   "

## 2024-02-20 ENCOUNTER — Ambulatory Visit: Payer: Self-pay | Admitting: Family

## 2024-02-20 LAB — BASIC METABOLIC PANEL WITH GFR
BUN/Creatinine Ratio: 24 — ABNORMAL HIGH (ref 9–23)
BUN: 22 mg/dL (ref 6–24)
CO2: 22 mmol/L (ref 20–29)
Calcium: 9.6 mg/dL (ref 8.7–10.2)
Chloride: 104 mmol/L (ref 96–106)
Creatinine, Ser: 0.9 mg/dL (ref 0.57–1.00)
Glucose: 92 mg/dL (ref 70–99)
Potassium: 4.4 mmol/L (ref 3.5–5.2)
Sodium: 143 mmol/L (ref 134–144)
eGFR: 74 mL/min/1.73

## 2024-02-20 LAB — HEMOGLOBIN A1C
Est. average glucose Bld gHb Est-mCnc: 123 mg/dL
Hgb A1c MFr Bld: 5.9 % — ABNORMAL HIGH (ref 4.8–5.6)

## 2024-03-21 ENCOUNTER — Encounter: Payer: Self-pay | Admitting: Family

## 2024-05-24 ENCOUNTER — Ambulatory Visit: Payer: Self-pay | Admitting: Family
